# Patient Record
Sex: Female | Born: 1985 | Race: White | Hispanic: No | Marital: Married | Smoking: Former smoker
Health system: Southern US, Community
[De-identification: ages and names within clinical notes are randomized; demographics above are authoritative.]

## PROBLEM LIST (undated history)

## (undated) ENCOUNTER — Inpatient Hospital Stay (HOSPITAL_COMMUNITY): Payer: Self-pay

## (undated) DIAGNOSIS — O021 Missed abortion: Secondary | ICD-10-CM

## (undated) DIAGNOSIS — D649 Anemia, unspecified: Secondary | ICD-10-CM

## (undated) DIAGNOSIS — K219 Gastro-esophageal reflux disease without esophagitis: Secondary | ICD-10-CM

## (undated) DIAGNOSIS — J45909 Unspecified asthma, uncomplicated: Secondary | ICD-10-CM

---

## 2011-05-01 HISTORY — PX: LAPAROSCOPIC CHOLECYSTECTOMY: SUR755

## 2011-07-01 ENCOUNTER — Emergency Department (HOSPITAL_BASED_OUTPATIENT_CLINIC_OR_DEPARTMENT_OTHER)
Admission: EM | Admit: 2011-07-01 | Discharge: 2011-07-01 | Disposition: A | Discharge status: Home Health | Payer: PRIVATE HEALTH INSURANCE | Attending: Emergency Medicine | Admitting: Emergency Medicine

## 2011-07-01 ENCOUNTER — Encounter (HOSPITAL_BASED_OUTPATIENT_CLINIC_OR_DEPARTMENT_OTHER): Payer: Self-pay

## 2011-07-01 DIAGNOSIS — R112 Nausea with vomiting, unspecified: Secondary | ICD-10-CM | POA: Insufficient documentation

## 2011-07-01 DIAGNOSIS — R109 Unspecified abdominal pain: Secondary | ICD-10-CM | POA: Insufficient documentation

## 2011-07-01 DIAGNOSIS — R5381 Other malaise: Secondary | ICD-10-CM | POA: Insufficient documentation

## 2011-07-01 DIAGNOSIS — IMO0001 Reserved for inherently not codable concepts without codable children: Secondary | ICD-10-CM | POA: Insufficient documentation

## 2011-07-01 DIAGNOSIS — R000 Tachycardia, unspecified: Secondary | ICD-10-CM | POA: Insufficient documentation

## 2011-07-01 DIAGNOSIS — Z79899 Other long term (current) drug therapy: Secondary | ICD-10-CM | POA: Insufficient documentation

## 2011-07-01 DIAGNOSIS — R5383 Other fatigue: Secondary | ICD-10-CM | POA: Insufficient documentation

## 2011-07-01 LAB — DIFFERENTIAL
Basophils Relative: 0 % (ref 0–1)
Lymphs Abs: 0.5 10*3/uL — ABNORMAL LOW (ref 0.7–4.0)
Monocytes Absolute: 0.4 10*3/uL (ref 0.1–1.0)
Monocytes Relative: 5 % (ref 3–12)
Neutro Abs: 8.5 10*3/uL — ABNORMAL HIGH (ref 1.7–7.7)

## 2011-07-01 LAB — COMPREHENSIVE METABOLIC PANEL
Albumin: 4.4 g/dL (ref 3.5–5.2)
BUN: 13 mg/dL (ref 6–23)
Chloride: 99 mEq/L (ref 96–112)
Creatinine, Ser: 0.7 mg/dL (ref 0.50–1.10)
GFR calc Af Amer: >90 mL/min (ref >90)
GFR calc non Af Amer: >90 mL/min (ref >90)
Glucose, Bld: 122 mg/dL — ABNORMAL HIGH (ref 70–99)
Total Bilirubin: 0.5 mg/dL (ref 0.3–1.2)

## 2011-07-01 LAB — CBC
HCT: 41.8 % (ref 36.0–46.0)
Hemoglobin: 15.2 g/dL — ABNORMAL HIGH (ref 12.0–15.0)
MCH: 30.2 pg (ref 26.0–34.0)
MCHC: 36.4 g/dL — ABNORMAL HIGH (ref 30.0–36.0)

## 2011-07-01 LAB — URINALYSIS, ROUTINE W REFLEX MICROSCOPIC
Glucose, UA: NEGATIVE mg/dL
Hgb urine dipstick: NEGATIVE
Ketones, ur: 15 mg/dL — AB
Protein, ur: NEGATIVE mg/dL

## 2011-07-01 LAB — LIPASE, BLOOD: Lipase: 10 U/L — ABNORMAL LOW (ref 11–59)

## 2011-07-01 LAB — PREGNANCY, URINE: Preg Test, Ur: NEGATIVE

## 2011-07-01 MED ORDER — ONDANSETRON HCL 4 MG/2ML IJ SOLN
4.0000 mg | Freq: Once | INTRAMUSCULAR | Status: AC
  Administered 2011-07-01: 4 mg via INTRAVENOUS
  Filled 2011-07-01: qty 2

## 2011-07-01 MED ORDER — ONDANSETRON 8 MG PO TBDP: 8.0000 mg | ORAL_TABLET | Freq: Three times a day (TID) | ORAL | Status: AC | PRN

## 2011-07-01 MED ORDER — OXYCODONE-ACETAMINOPHEN 5-325 MG PO TABS: 1.0000 | ORAL_TABLET | Freq: Four times a day (QID) | ORAL | Status: AC | PRN

## 2011-07-01 MED ORDER — SODIUM CHLORIDE 0.9 % IV BOLUS (SEPSIS)
1000.0000 mL | Freq: Once | INTRAVENOUS | Status: AC
  Administered 2011-07-01: 1000 mL via INTRAVENOUS

## 2011-07-01 NOTE — ED Provider Notes (Signed)
History     CSN: [id number]  Arrival date & time 07/01/11  1118   First MD Initiated Contact with Patient 07/01/11 1136      Chief Complaint  Patient presents with  . Abdominal Pain    (Consider location/radiation/quality/duration/timing/severity/associated sxs/prior treatment) Patient is a 26 y.o. female presenting with abdominal pain. The history is provided by the patient.  Abdominal Pain The primary symptoms of the illness include abdominal pain, fatigue, nausea and vomiting. The primary symptoms of the illness do not include shortness of breath or diarrhea.  Symptoms associated with the illness do not include back pain.   patient's had nausea vomiting since this morning. No diarrhea. She states she's had some blackish stools. She's had no chills. Mild upper abdominal pain. She states she has had people at work that had nausea vomiting diarrhea and chills. Patient denies possibility of pregnancy. She states she had a couple glasses wine a couple days ago.  History reviewed. No pertinent past medical history.  History reviewed. No pertinent past surgical history.  History reviewed. No pertinent family history.  History  Substance Use Topics  . Smoking status: Former Smoker    Quit date: 07/01/2010  . Smokeless tobacco: Not on file  . Alcohol Use: Yes     occasional use    OB History    Grav Para Term Preterm Abortions TAB SAB Ect Mult Living                  Review of Systems  Constitutional: Positive for fatigue. Negative for activity change and appetite change.  HENT: Negative for neck stiffness.   Eyes: Negative for pain.  Respiratory: Negative for chest tightness and shortness of breath.   Cardiovascular: Negative for chest pain and leg swelling.  Gastrointestinal: Positive for nausea, vomiting and abdominal pain. Negative for diarrhea.  Genitourinary: Negative for flank pain.  Musculoskeletal: Positive for myalgias. Negative for back pain.  Skin: Negative for  rash.  Neurological: Negative for weakness, numbness and headaches.  Psychiatric/Behavioral: Negative for behavioral problems.    Allergies  Penicillins  Home Medications   Current Outpatient Rx  Name Route Sig Dispense Refill  . ALPRAZOLAM 0.5 MG PO TABS Oral Take 0.5 mg by mouth 3 (three) times daily as needed.    . BUPROPION HCL ER (XL) 300 MG PO TB24 Oral Take 300 mg by mouth daily.    Marland Kitchen PANTOPRAZOLE SODIUM 40 MG PO TBEC Oral Take 40 mg by mouth daily.    Marland Kitchen ONDANSETRON 8 MG PO TBDP Oral Take 1 tablet (8 mg total) by mouth every 8 (eight) hours as needed for nausea. 20 tablet 0  . OXYCODONE-ACETAMINOPHEN 5-325 MG PO TABS Oral Take 1-2 tablets by mouth every 6 (six) hours as needed for pain. 10 tablet 0    BP 107/74  Pulse 104  Temp 98.9 F (37.2 C)  Resp 19  Ht 5\' 5"  (1.651 m)  Wt 135 lb (61.236 kg)  BMI 22.47 kg/m2  SpO2 99%  LMP 06/24/2011  Physical Exam  Nursing note and vitals reviewed. Constitutional: She is oriented to person, place, and time. She appears well-developed and well-nourished.  HENT:  Head: Normocephalic and atraumatic.  Eyes: EOM are normal. Pupils are equal, round, and reactive to light.  Neck: Normal range of motion. Neck supple.  Cardiovascular: Regular rhythm and normal heart sounds.   No murmur heard.      Tachycardia  Pulmonary/Chest: Effort normal and breath sounds normal. No respiratory distress. She  has no wheezes. She has no rales.  Abdominal: Soft. Bowel sounds are normal. She exhibits no distension. There is tenderness. There is no rebound and no guarding.       Mild upper abdominal tenderness without rebound or guarding  Musculoskeletal: Normal range of motion.  Neurological: She is alert and oriented to person, place, and time. No cranial nerve deficit.  Skin: Skin is warm and dry.  Psychiatric: She has a normal mood and affect. Her speech is normal.    ED Course  Procedures (including critical care time)  Labs Reviewed    URINALYSIS, ROUTINE W REFLEX MICROSCOPIC - Abnormal; Notable for the following:    Color, Urine AMBER (*) BIOCHEMICALS MAY BE AFFECTED BY COLOR   Specific Gravity, Urine 1.034 (*)    Bilirubin Urine SMALL (*)    Ketones, ur 15 (*)    All other components within normal limits  CBC - Abnormal; Notable for the following:    Hemoglobin 15.2 (*)    MCHC 36.4 (*)    All other components within normal limits  DIFFERENTIAL - Abnormal; Notable for the following:    Neutrophils Relative 90 (*)    Neutro Abs 8.5 (*)    Lymphocytes Relative 5 (*)    Lymphs Abs 0.5 (*)    All other components within normal limits  COMPREHENSIVE METABOLIC PANEL - Abnormal; Notable for the following:    Potassium 3.4 (*)    Glucose, Bld 122 (*)    All other components within normal limits  LIPASE, BLOOD - Abnormal; Notable for the following:    Lipase 10 (*)    All other components within normal limits  PREGNANCY, URINE   No results found.   1. Abdominal pain   2. Nausea and vomiting       MDM  Upper abdominal pain. Nausea vomiting. Lab work is reassuring. sHe appears to be hemoconcentrated. He feels much better after fluids and her vitals are improved. She she is tolerated orals to be discharged home with pain medicines and antiemetics.        Juliet Rude. Rubin Payor, MD 07/01/11 1353

## 2011-07-01 NOTE — ED Notes (Signed)
Pt describes nonstop vomiting since this morning.  Pt states that she also has black stools, Pt with abdominal pain in the upper abdomen, soreness "from vomiting"

## 2011-10-22 ENCOUNTER — Other Ambulatory Visit: Payer: Self-pay | Admitting: Gastroenterology

## 2011-10-22 DIAGNOSIS — R1013 Epigastric pain: Secondary | ICD-10-CM

## 2011-10-26 ENCOUNTER — Other Ambulatory Visit: Payer: PRIVATE HEALTH INSURANCE

## 2011-10-26 ENCOUNTER — Encounter: Payer: Self-pay | Admitting: Obstetrics and Gynecology

## 2011-11-08 DIAGNOSIS — G4700 Insomnia, unspecified: Secondary | ICD-10-CM | POA: Insufficient documentation

## 2011-11-09 ENCOUNTER — Encounter: Payer: Self-pay | Admitting: Obstetrics and Gynecology

## 2012-01-23 ENCOUNTER — Inpatient Hospital Stay (HOSPITAL_COMMUNITY): Payer: PRIVATE HEALTH INSURANCE

## 2012-01-23 ENCOUNTER — Encounter (HOSPITAL_COMMUNITY): Payer: Self-pay

## 2012-01-23 ENCOUNTER — Inpatient Hospital Stay (HOSPITAL_COMMUNITY)
Admission: AD | Admit: 2012-01-23 | Discharge: 2012-01-23 | Disposition: A | Discharge status: Home / Self Care | Payer: PRIVATE HEALTH INSURANCE | Source: Ambulatory Visit | Attending: Obstetrics and Gynecology | Admitting: Obstetrics and Gynecology

## 2012-01-23 DIAGNOSIS — O26859 Spotting complicating pregnancy, unspecified trimester: Secondary | ICD-10-CM

## 2012-01-23 DIAGNOSIS — R109 Unspecified abdominal pain: Secondary | ICD-10-CM | POA: Insufficient documentation

## 2012-01-23 DIAGNOSIS — O209 Hemorrhage in early pregnancy, unspecified: Secondary | ICD-10-CM

## 2012-01-23 HISTORY — DX: Unspecified asthma, uncomplicated: J45.909

## 2012-01-23 LAB — CBC
HCT: 35.3 % — ABNORMAL LOW (ref 36.0–46.0)
MCHC: 34.6 g/dL (ref 30.0–36.0)
Platelets: 192 10*3/uL (ref 150–400)
RDW: 12.1 % (ref 11.5–15.5)
WBC: 6.9 10*3/uL (ref 4.0–10.5)

## 2012-01-23 LAB — POCT PREGNANCY, URINE: Preg Test, Ur: POSITIVE — AB

## 2012-01-23 LAB — ABO/RH: ABO/RH(D): O POS

## 2012-01-23 LAB — HCG, QUANTITATIVE, PREGNANCY: hCG, Beta Chain, Quant, S: 1565 m[IU]/mL — ABNORMAL HIGH (ref <5)

## 2012-01-23 NOTE — MAU Note (Signed)
Was at work, started having cramping in lower abd and noted some ?  Brownish spotting.

## 2012-01-23 NOTE — MAU Provider Note (Signed)
History     CSN: [id number]  Arrival date and time: 01/23/12 1649   First Provider Initiated Contact with Patient 01/23/12 1748      Chief Complaint  Patient presents with  . Vaginal Bleeding  . Abdominal Pain   HPIKrystin Lucila Rice is 26 y.o. G1P0 [redacted]w[redacted]d weeks presenting with cramping X 1 week and dark brown discharge.  [facility]ed [facility] instructed her to come here because she doesn't start prenatal care there until Oct 7.  LMP 12/15/11.  Slight nausea without vomiting.  Last intercourse 2 weeks ago.  Patient was Xanax prn and Wellbutrin, originally prescribed to stop smoking and kept taking it.  Discontinued both end of August.  Denies adverse affects from discontinuation of these medications.      Past Medical History  Diagnosis Date  . Anxiety   . Asthma     sports induced    Past Surgical History  Procedure Date  . Cholecystectomy     History reviewed. No pertinent family history.  History  Substance Use Topics  . Smoking status: Former Smoker    Quit date: 07/01/2010  . Smokeless tobacco: Not on file  . Alcohol Use: Yes     occasional use    Allergies:  Allergies  Allergen Reactions  . Penicillins Other (See Comments)    Migraines   . Ciprofloxacin Nausea And Vomiting    Prescriptions prior to admission  Medication Sig Dispense Refill  . ALPRAZolam (XANAX) 0.5 MG tablet Take 0.5 mg by mouth 3 (three) times daily as needed.      Marland Kitchen buPROPion (WELLBUTRIN XL) 300 MG 24 hr tablet Take 300 mg by mouth daily.      Marland Kitchen HYDROcodone-acetaminophen (VICODIN) 2.5-500 MG per tablet Take 1 tablet by mouth every 6 (six) hours as needed. cramping      . pantoprazole (PROTONIX) 40 MG tablet Take 40 mg by mouth daily.        Review of Systems  Constitutional: Negative.   Respiratory: Negative.   Cardiovascular: Negative.   Gastrointestinal: Positive for nausea and abdominal pain (cramping). Negative for vomiting.  Genitourinary:       + brown spotting    Physical Exam   Blood pressure 119/96, pulse 116, temperature 98.1 F (36.7 C), temperature source Oral, resp. rate 18, height 5\' 4"  (1.626 m), weight 60.782 kg (134 lb), last menstrual period 12/15/2011.  Physical Exam  Constitutional: She is oriented to person, place, and time. She appears well-developed and well-nourished. No distress.  HENT:  Head: Normocephalic.  Neck: Normal range of motion.  Cardiovascular: Normal rate.   Respiratory: Effort normal.  GI: Soft. She exhibits no distension and no mass. There is no tenderness. There is no rebound and no guarding.  Genitourinary: Uterus is enlarged (slightly). Uterus is not tender. Cervix exhibits friability. Cervix exhibits no discharge. Right adnexum displays no mass, no tenderness and no fullness. Left adnexum displays no mass, no tenderness and no fullness. No bleeding (without active bleeding) around the vagina. Vaginal discharge (brown streaking.  No odor) found.  Neurological: She is alert and oriented to person, place, and time.  Skin: Skin is warm and dry.  Psychiatric: She has a normal mood and affect. Her behavior is normal.   Results for orders placed during the hospital encounter of 01/23/12 (from the past 24 hour(s))  POCT PREGNANCY, URINE     Status: Abnormal   Collection Time   01/23/12  5:35 PM      Component Value Range  Preg Test, Ur POSITIVE (*) NEGATIVE  CBC     Status: Abnormal   Collection Time   01/23/12  6:04 PM      Component Value Range   WBC 6.9  4.0 - 10.5 K/uL   RBC 4.10  3.87 - 5.11 MIL/uL   Hemoglobin 12.2  12.0 - 15.0 g/dL   HCT 96.0 (*) 45.4 - 09.8 %   MCV 86.1  78.0 - 100.0 fL   MCH 29.8  26.0 - 34.0 pg   MCHC 34.6  30.0 - 36.0 g/dL   RDW 11.9  14.7 - 82.9 %   Platelets 192  150 - 400 K/uL  ABO/RH     Status: Normal   Collection Time   01/23/12  6:04 PM      Component Value Range   ABO/RH(D) O POS    HCG, QUANTITATIVE, PREGNANCY     Status: Abnormal   Collection Time   01/23/12  6:04  PM      Component Value Range   hCG, Beta Chain, Quant, S 1565 (*) <5 mIU/mL   Clinical Data: 26 year old female who is 5 weeks and 4 days  gestational age by LMP with quantitative beta HCG of 1565.  Spotting and pain.  OBSTETRIC <14 WK [place] AND TRANSVAGINAL OB US  Technique: Both transabdominal and transvaginal ultrasound  examinations were performed for complete evaluation of the  gestation as well as the maternal uterus, adnexal regions, and  pelvic cul-de-sac. Transvaginal technique was performed to assess  early pregnancy.  Comparison: None.  Intrauterine gestational sac: Possible single  Yolk sac: Not visible  Embryo: Not visible  Cardiac Activity: Not detected  MSD: 3.1 mm 4 w 5 d  [place] EDC: 09/26/2012  Maternal uterus/adnexae:  No subchorionic hemorrhage or pelvic free fluid. Both ovaries are  somewhat heterogeneous but normal in size. The left measures 2.6 x  1.5 x 1.5 cm. The right measures 2.8 x 2.0 x 2.0 cm and probably  contains the corpus luteum (see image 23).  IMPRESSION:  1. Possible early intrauterine pregnancy, but no yolk sac or fetal  pole identified. Mean sac diameter would suggest a gestational age  of [redacted] weeks and 5 days. Recommend correlation with serial  quantitative BHCG and followup imaging.  2. No subchorionic hemorrhage or free fluid. No suspicious  adnexal finding.  Original Report Authenticated By: Harley Hallmark, M.D.    MAU Course  Procedures  MDM Patient declined wet prep/cultures because she has first OB appt on Oct 7 and is aware they will be done at that time.  Assessment and Plan  A:  Spotting in early pregnancy [redacted]w[redacted]d gestation  P:  Repeat BHCG in Saturday am on 9/28.     Pelvic rest     Return for heavy vaginal bleeding or increased pain.  [name],EVE M 01/23/2012, 5:49 PM

## 2012-01-23 NOTE — MAU Note (Signed)
Patient is in with c/o having brown blood bleeding that started today. She also states that she is having intermittent mild cramping. She have no recent intercourse.

## 2012-01-26 ENCOUNTER — Inpatient Hospital Stay (HOSPITAL_COMMUNITY)
Admission: AD | Admit: 2012-01-26 | Discharge: 2012-01-26 | Disposition: A | Discharge status: Home / Self Care | Payer: PRIVATE HEALTH INSURANCE | Source: Ambulatory Visit | Attending: Obstetrics & Gynecology | Admitting: Obstetrics & Gynecology

## 2012-01-26 DIAGNOSIS — O26859 Spotting complicating pregnancy, unspecified trimester: Secondary | ICD-10-CM

## 2012-01-26 NOTE — MAU Provider Note (Signed)
  History     CSN: [id number]  Arrival date and time: 01/26/12 1247   First Provider Initiated Contact with Patient 01/26/12 1412      Chief Complaint  Patient presents with  . Follow-up   HPI Cindy Rice 26 y.o. [redacted]w[redacted]d  Here for follow up quant today.  Was seen on 01-23-12.  Records reviewed.  No pain or bleeding today.  OB History    Grav Para Term Preterm Abortions TAB SAB Ect Mult Living   1               Past Medical History  Diagnosis Date  . Anxiety   . Asthma     sports induced    Past Surgical History  Procedure Date  . Cholecystectomy     No family history on file.  History  Substance Use Topics  . Smoking status: Former Smoker    Quit date: 07/01/2010  . Smokeless tobacco: Not on file  . Alcohol Use: Yes     occasional use    Allergies:  Allergies  Allergen Reactions  . Penicillins Other (See Comments)    Migraines   . Ciprofloxacin Nausea And Vomiting    Prescriptions prior to admission  Medication Sig Dispense Refill  . pantoprazole (PROTONIX) 40 MG tablet Take 40 mg by mouth daily.        Review of Systems  Constitutional: Negative for fever.  Gastrointestinal: Negative for nausea, vomiting and abdominal pain.  Genitourinary: Negative for dysuria.       No vaginal bleeding today   Physical Exam   Blood pressure 118/84, pulse 104, temperature 98.1 F (36.7 C), temperature source Oral, resp. rate 16, last menstrual period 12/15/2011, SpO2 100.00%.  Physical Exam  Nursing note and vitals reviewed. Constitutional: She is oriented to person, place, and time. She appears well-developed and well-nourished.  HENT:  Head: Normocephalic.  Eyes: EOM are normal.  Neck: Neck supple.  Musculoskeletal: Normal range of motion.  Neurological: She is alert and oriented to person, place, and time.  Skin: Skin is warm and dry.  Psychiatric: She has a normal mood and affect.    MAU Course  Procedures  MDM Results for Cindy, Rice  (MRN [id number]) as of 01/26/2012 14:19  Ref. Range 07/01/2011 11:47 07/01/2011 12:52 01/23/2012 17:35 01/23/2012 18:04 01/23/2012 19:42 01/24/2012 02:11 01/26/2012 13:18  hCG, Beta Chain, Quant, S Latest Range: <5 mIU/mL    1565 (H)   5528 (H)    Assessment and Plan  Rising Quants  Plan Ultrasound will call to schedule [place] on 01-30-12 as follow up.  Cindy Rice 01/26/2012, 2:13 PM

## 2012-01-26 NOTE — MAU Note (Signed)
Patient to MAU for repeat BHCG. Patient denies any pain or bleeding.  

## 2012-01-30 ENCOUNTER — Other Ambulatory Visit (HOSPITAL_COMMUNITY): Payer: Self-pay | Admitting: Nurse Practitioner

## 2012-01-30 ENCOUNTER — Ambulatory Visit (HOSPITAL_COMMUNITY)
Admission: RE | Admit: 2012-01-30 | Discharge: 2012-01-30 | Disposition: A | Discharge status: Home / Self Care | Payer: PRIVATE HEALTH INSURANCE | Source: Ambulatory Visit | Attending: Nurse Practitioner | Admitting: Nurse Practitioner

## 2012-01-30 ENCOUNTER — Encounter (HOSPITAL_COMMUNITY): Payer: Self-pay | Admitting: *Deleted

## 2012-01-30 ENCOUNTER — Inpatient Hospital Stay (HOSPITAL_COMMUNITY)
Admission: AD | Admit: 2012-01-30 | Discharge: 2012-01-30 | Disposition: A | Discharge status: Home / Self Care | Payer: PRIVATE HEALTH INSURANCE | Source: Ambulatory Visit | Attending: Obstetrics & Gynecology | Admitting: Obstetrics & Gynecology

## 2012-01-30 DIAGNOSIS — Z3490 Encounter for supervision of normal pregnancy, unspecified, unspecified trimester: Secondary | ICD-10-CM

## 2012-01-30 DIAGNOSIS — O3680X0 Pregnancy with inconclusive fetal viability, not applicable or unspecified: Secondary | ICD-10-CM | POA: Insufficient documentation

## 2012-01-30 DIAGNOSIS — O26859 Spotting complicating pregnancy, unspecified trimester: Secondary | ICD-10-CM

## 2012-01-30 DIAGNOSIS — O99891 Other specified diseases and conditions complicating pregnancy: Secondary | ICD-10-CM | POA: Insufficient documentation

## 2012-01-30 DIAGNOSIS — Z1389 Encounter for screening for other disorder: Secondary | ICD-10-CM

## 2012-01-30 DIAGNOSIS — Z3689 Encounter for other specified antenatal screening: Secondary | ICD-10-CM | POA: Insufficient documentation

## 2012-01-30 NOTE — MAU Note (Signed)
Rollover from [place], viability scan.  No complaints.

## 2012-01-30 NOTE — MAU Provider Note (Signed)
  Cindy Rice is a 26 y.o. female who returns for follow up ultrasound for progression of pregnancy. Today on ultrasound there is a YS.  Patient is to start prenatal care and return as needed for problems. LMP 12/15/2011  [name]

## 2012-02-14 LAB — OB RESULTS CONSOLE RPR: RPR: NONREACTIVE

## 2012-04-30 NOTE — L&D Delivery Note (Signed)
Operative Delivery Note At 6:43 PM a viable female was delivered via Vaginal, Spontaneous Delivery.  Presentation: vertex; Position: Right,, Occiput,, Transverse; Station: +2.  Verbal consent: obtained from patient.  Risks and benefits discussed in detail.  Risks include, but are not limited to the risks of anesthesia, bleeding, infection, damage to maternal tissues, fetal cephalhematoma.  There is also the risk of inability to effect vaginal delivery of the head, or shoulder dystocia that cannot be resolved by established maneuvers, leading to the need for emergency cesarean section.  APGAR: 9, 9; weight .   Placenta status: Intact, Spontaneous.   Cord: 3 vessels with the following complications: None.  Cord pH: not done  Anesthesia: Epidural  Instruments: mushroom Episiotomy: None Lacerations: 2nd degree Suture Repair: 3.0 chromic Est. Blood Loss (mL): 300  Mom to postpartum.  Baby to nursery-stable.  Kourtnie Sachs L 09/23/2012, 6:59 PM

## 2012-09-18 ENCOUNTER — Encounter (HOSPITAL_COMMUNITY): Payer: Self-pay | Admitting: *Deleted

## 2012-09-18 ENCOUNTER — Inpatient Hospital Stay (HOSPITAL_COMMUNITY)
Admission: AD | Admit: 2012-09-18 | Discharge: 2012-09-18 | Disposition: A | Discharge status: Home / Self Care | Payer: 59 | Source: Ambulatory Visit | Attending: Obstetrics and Gynecology | Admitting: Obstetrics and Gynecology

## 2012-09-18 DIAGNOSIS — O479 False labor, unspecified: Secondary | ICD-10-CM | POA: Insufficient documentation

## 2012-09-18 NOTE — MAU Note (Signed)
Pt states pain and pressure began earlier in the afternoon and noticed some brown mucus around noon. Denies LOF. Good FM.

## 2012-09-23 ENCOUNTER — Inpatient Hospital Stay (HOSPITAL_COMMUNITY)
Admission: AD | Admit: 2012-09-23 | Discharge: 2012-09-25 | DRG: 775 | Disposition: A | Discharge status: Home / Self Care | Payer: 59 | Source: Ambulatory Visit | Attending: Obstetrics and Gynecology | Admitting: Obstetrics and Gynecology

## 2012-09-23 ENCOUNTER — Encounter (HOSPITAL_COMMUNITY): Payer: Self-pay | Admitting: Anesthesiology

## 2012-09-23 ENCOUNTER — Encounter (HOSPITAL_COMMUNITY): Payer: Self-pay | Admitting: *Deleted

## 2012-09-23 ENCOUNTER — Inpatient Hospital Stay (HOSPITAL_COMMUNITY): Payer: 59 | Admitting: Anesthesiology

## 2012-09-23 LAB — CBC
HCT: 36.4 % (ref 36.0–46.0)
HCT: 33.5 % — ABNORMAL LOW (ref 36.0–46.0)
Hemoglobin: 12.6 g/dL (ref 12.0–15.0)
Hemoglobin: 11.4 g/dL — ABNORMAL LOW (ref 12.0–15.0)
MCH: 29.9 pg (ref 26.0–34.0)
MCH: 29.8 pg (ref 26.0–34.0)
MCHC: 34 g/dL (ref 30.0–36.0)
MCV: 86.3 fL (ref 78.0–100.0)
MCV: 87.5 fL (ref 78.0–100.0)
Platelets: 124 10*3/uL — ABNORMAL LOW (ref 150–400)
Platelets: 123 K/uL — ABNORMAL LOW (ref 150–400)
RBC: 4.22 MIL/uL (ref 3.87–5.11)
RBC: 3.83 MIL/uL — ABNORMAL LOW (ref 3.87–5.11)
RDW: 13.2 % (ref 11.5–15.5)
WBC: 11.7 10*3/uL — ABNORMAL HIGH (ref 4.0–10.5)
WBC: 19.9 K/uL — ABNORMAL HIGH (ref 4.0–10.5)

## 2012-09-23 LAB — OB RESULTS CONSOLE GBS: GBS: NEGATIVE

## 2012-09-23 MED ORDER — DIPHENHYDRAMINE HCL 25 MG PO CAPS: 25.0000 mg | ORAL_CAPSULE | Freq: Four times a day (QID) | ORAL | Status: DC | PRN

## 2012-09-23 MED ORDER — WITCH HAZEL-GLYCERIN EX PADS
1.0000 "application " | MEDICATED_PAD | CUTANEOUS | Status: DC | PRN
  Administered 2012-09-24: 1 via TOPICAL

## 2012-09-23 MED ORDER — OXYTOCIN 40 UNITS IN LACTATED RINGERS INFUSION - SIMPLE MED
62.5000 mL/h | INTRAVENOUS | Status: DC
  Filled 2012-09-23: qty 1000

## 2012-09-23 MED ORDER — OXYCODONE-ACETAMINOPHEN 5-325 MG PO TABS: 1.0000 | ORAL_TABLET | ORAL | Status: DC | PRN

## 2012-09-23 MED ORDER — ONDANSETRON HCL 4 MG/2ML IJ SOLN: 4.0000 mg | INTRAMUSCULAR | Status: DC | PRN

## 2012-09-23 MED ORDER — BISACODYL 10 MG RE SUPP: 10.0000 mg | Freq: Every day | RECTAL | Status: DC | PRN

## 2012-09-23 MED ORDER — LACTATED RINGERS IV SOLN: 500.0000 mL | INTRAVENOUS | Status: DC | PRN

## 2012-09-23 MED ORDER — CITRIC ACID-SODIUM CITRATE 334-500 MG/5ML PO SOLN: 30.0000 mL | ORAL | Status: DC | PRN

## 2012-09-23 MED ORDER — IBUPROFEN 600 MG PO TABS: 600.0000 mg | ORAL_TABLET | Freq: Four times a day (QID) | ORAL | Status: DC | PRN

## 2012-09-23 MED ORDER — SENNOSIDES-DOCUSATE SODIUM 8.6-50 MG PO TABS
2.0000 | ORAL_TABLET | Freq: Every day | ORAL | Status: DC
  Administered 2012-09-23 – 2012-09-24 (×2): 2 via ORAL

## 2012-09-23 MED ORDER — ONDANSETRON HCL 4 MG/2ML IJ SOLN
4.0000 mg | Freq: Four times a day (QID) | INTRAMUSCULAR | Status: DC | PRN
  Administered 2012-09-23: 4 mg via INTRAVENOUS
  Filled 2012-09-23: qty 2

## 2012-09-23 MED ORDER — SIMETHICONE 80 MG PO CHEW: 80.0000 mg | CHEWABLE_TABLET | ORAL | Status: DC | PRN

## 2012-09-23 MED ORDER — PHENYLEPHRINE 40 MCG/ML (10ML) SYRINGE FOR IV PUSH (FOR BLOOD PRESSURE SUPPORT)
80.0000 ug | PREFILLED_SYRINGE | INTRAVENOUS | Status: DC | PRN
  Filled 2012-09-23: qty 2

## 2012-09-23 MED ORDER — ONDANSETRON HCL 4 MG PO TABS: 4.0000 mg | ORAL_TABLET | ORAL | Status: DC | PRN

## 2012-09-23 MED ORDER — OXYCODONE-ACETAMINOPHEN 5-325 MG PO TABS
1.0000 | ORAL_TABLET | ORAL | Status: DC | PRN
  Administered 2012-09-24 (×2): 1 via ORAL
  Filled 2012-09-23 (×2): qty 1

## 2012-09-23 MED ORDER — PRENATAL MULTIVITAMIN CH
1.0000 | ORAL_TABLET | Freq: Every day | ORAL | Status: DC
  Administered 2012-09-24: 1 via ORAL
  Filled 2012-09-23: qty 1

## 2012-09-23 MED ORDER — MEASLES, MUMPS & RUBELLA VAC ~~LOC~~ INJ: 0.5000 mL | INJECTION | Freq: Once | SUBCUTANEOUS | Status: DC

## 2012-09-23 MED ORDER — TETANUS-DIPHTH-ACELL PERTUSSIS 5-2.5-18.5 LF-MCG/0.5 IM SUSP: 0.5000 mL | Freq: Once | INTRAMUSCULAR | Status: DC

## 2012-09-23 MED ORDER — DIBUCAINE 1 % RE OINT: 1.0000 "application " | TOPICAL_OINTMENT | RECTAL | Status: DC | PRN

## 2012-09-23 MED ORDER — LANOLIN HYDROUS EX OINT: TOPICAL_OINTMENT | CUTANEOUS | Status: DC | PRN

## 2012-09-23 MED ORDER — LIDOCAINE HCL (PF) 1 % IJ SOLN
30.0000 mL | INTRAMUSCULAR | Status: DC | PRN
  Filled 2012-09-23: qty 30

## 2012-09-23 MED ORDER — EPHEDRINE 5 MG/ML INJ
10.0000 mg | INTRAVENOUS | Status: DC | PRN
  Filled 2012-09-23: qty 2
  Filled 2012-09-23: qty 4

## 2012-09-23 MED ORDER — LIDOCAINE HCL (PF) 1 % IJ SOLN
INTRAMUSCULAR | Status: DC | PRN
  Administered 2012-09-23 (×4): 4 mL

## 2012-09-23 MED ORDER — DIPHENHYDRAMINE HCL 50 MG/ML IJ SOLN: 12.5000 mg | INTRAMUSCULAR | Status: DC | PRN

## 2012-09-23 MED ORDER — FENTANYL 2.5 MCG/ML BUPIVACAINE 1/10 % EPIDURAL INFUSION (WH - ANES)
14.0000 mL/h | INTRAMUSCULAR | Status: DC | PRN
  Administered 2012-09-23: 14 mL/h via EPIDURAL
  Filled 2012-09-23: qty 125

## 2012-09-23 MED ORDER — ACETAMINOPHEN 325 MG PO TABS: 650.0000 mg | ORAL_TABLET | ORAL | Status: DC | PRN

## 2012-09-23 MED ORDER — ZOLPIDEM TARTRATE 5 MG PO TABS: 5.0000 mg | ORAL_TABLET | Freq: Every evening | ORAL | Status: DC | PRN

## 2012-09-23 MED ORDER — OXYTOCIN BOLUS FROM INFUSION: 500.0000 mL | INTRAVENOUS | Status: DC

## 2012-09-23 MED ORDER — FLEET ENEMA 7-19 GM/118ML RE ENEM: 1.0000 | ENEMA | Freq: Every day | RECTAL | Status: DC | PRN

## 2012-09-23 MED ORDER — EPHEDRINE 5 MG/ML INJ
10.0000 mg | INTRAVENOUS | Status: DC | PRN
  Filled 2012-09-23: qty 2

## 2012-09-23 MED ORDER — LACTATED RINGERS IV SOLN: INTRAVENOUS | Status: DC

## 2012-09-23 MED ORDER — IBUPROFEN 600 MG PO TABS
600.0000 mg | ORAL_TABLET | Freq: Four times a day (QID) | ORAL | Status: DC
  Administered 2012-09-24 – 2012-09-25 (×6): 600 mg via ORAL
  Filled 2012-09-23 (×6): qty 1

## 2012-09-23 MED ORDER — BENZOCAINE-MENTHOL 20-0.5 % EX AERO
1.0000 "application " | INHALATION_SPRAY | CUTANEOUS | Status: DC | PRN
  Administered 2012-09-24: 1 via TOPICAL
  Filled 2012-09-23: qty 56

## 2012-09-23 MED ORDER — MEDROXYPROGESTERONE ACETATE 150 MG/ML IM SUSP: 150.0000 mg | INTRAMUSCULAR | Status: DC | PRN

## 2012-09-23 MED ORDER — LACTATED RINGERS IV SOLN: 500.0000 mL | Freq: Once | INTRAVENOUS | Status: DC

## 2012-09-23 MED ORDER — PHENYLEPHRINE 40 MCG/ML (10ML) SYRINGE FOR IV PUSH (FOR BLOOD PRESSURE SUPPORT)
80.0000 ug | PREFILLED_SYRINGE | INTRAVENOUS | Status: DC | PRN
  Filled 2012-09-23: qty 2
  Filled 2012-09-23: qty 5

## 2012-09-23 NOTE — MAU Note (Signed)
UC's all night about apart. Water broke @ 0600. UC's more intense after that. States has had some BP concerns recently and to be scheduled for induction

## 2012-09-23 NOTE — H&P (Signed)
Cindy Rice is a 27 y.o. female presenting for SROM Maternal Medical History:  Reason for admission: Rupture of membranes.   Contractions: Onset was 3-5 hours ago.   Frequency: regular.   Perceived severity is strong.    Fetal activity: Perceived fetal activity is normal.      OB History   Grav Para Term Preterm Abortions TAB SAB Ect Mult Living   2    1  1    0     Past Medical History  Diagnosis Date  . Anxiety   . Asthma     sports induced   Past Surgical History  Procedure Laterality Date  . Cholecystectomy     Family History: family history is negative for Other. Social History:  reports that she quit smoking about 2 years ago. She has never used smokeless tobacco. She reports that  drinks alcohol. She reports that she does not use illicit drugs.   Prenatal Transfer Tool  Maternal Diabetes: No Genetic Screening: Normal Maternal Ultrasounds/Referrals: Normal Fetal Ultrasounds or other Referrals:  None Maternal Substance Abuse:  No Significant Maternal Medications:  None Significant Maternal Lab Results:  None Other Comments:  None  Review of Systems  All other systems reviewed and are negative.    Dilation: 5 Effacement (%): 100 Station: -1 Exam by:: Safi Culotta Blood pressure 131/73, pulse 87, temperature 98.5 F (36.9 C), temperature source Oral, resp. rate 18, height 5\' 5"  (1.651 m), weight 78.926 kg (174 lb), last menstrual period 12/15/2011, SpO2 99.00%. Maternal Exam:  Uterine Assessment: Contraction strength is mild.  Contraction frequency is irregular.   Abdomen: Patient reports no abdominal tenderness. Fetal presentation: vertex  Introitus: Normal vulva. Normal vagina.    Physical Exam  Vitals reviewed. Constitutional: She appears well-developed.  Cardiovascular: Normal rate and regular rhythm.   Respiratory: Effort normal and breath sounds normal.    Prenatal labs: ABO, Rh: --/--/O POS (09/25 1804) Antibody:   Rubella: Immune (10/17  0000) RPR: Nonreactive (10/17 0000)  HBsAg: Negative (10/17 0000)  HIV: Non-reactive (10/17 0000)  GBS: Negative (05/27 0000)   Assessment/Plan: SROM Labor Status post epidural Anticipate NSVD  Ivyrose Hashman L 09/23/2012, 2:00 PM

## 2012-09-23 NOTE — Anesthesia Procedure Notes (Signed)
Epidural Patient location during procedure: OB Start time: 09/23/2012 12:02 PM  Staffing Performed by: anesthesiologist   Preanesthetic Checklist Completed: patient identified, site marked, surgical consent, pre-op evaluation, timeout performed, IV checked, risks and benefits discussed and monitors and equipment checked  Epidural Patient position: sitting Prep: site prepped and draped and DuraPrep Patient monitoring: continuous pulse ox and blood pressure Approach: midline Injection technique: LOR air  Needle:  Needle type: Tuohy  Needle gauge: 17 G Needle length: 9 cm and 9 Needle insertion depth: 4.5 cm Catheter type: closed end flexible Catheter size: 19 Gauge Catheter at skin depth: 9.5 cm Test dose: negative  Assessment Events: blood not aspirated, injection not painful, no injection resistance, negative IV test and no paresthesia  Additional Notes Discussed risk of headache, infection, bleeding, nerve injury and failed or incomplete block.  Patient voices understanding and wishes to proceed.  Epidural placed easily on first attempt.  No paresthesia.  Patient tolerated procedure well with no apparent complications.  Jasmine December, MD Reason for block:procedure for pain

## 2012-09-23 NOTE — MAU Note (Signed)
Started leaking at 0630.  Had been contracting all night.  Dr's had been discussing inducing today or tomorrow, BP has been going up.

## 2012-09-23 NOTE — Anesthesia Preprocedure Evaluation (Signed)
Anesthesia Evaluation  Patient identified by MRN, date of birth, ID band Patient awake    Reviewed: Allergy & Precautions, H&P , NPO status , Patient's Chart, lab work & pertinent test results, reviewed documented beta blocker date and time   History of Anesthesia Complications Negative for: history of anesthetic complications  Airway Mallampati: III TM Distance: >3 FB Neck ROM: full    Dental  (+) Teeth Intact   Pulmonary asthma (exercise-induced, remote history) , former smoker,  breath sounds clear to auscultation        Cardiovascular negative cardio ROS  Rhythm:regular Rate:Normal     Neuro/Psych PSYCHIATRIC DISORDERS (anxiety) negative neurological ROS  negative psych ROS   GI/Hepatic negative GI ROS, Neg liver ROS,   Endo/Other  negative endocrine ROS  Renal/GU negative Renal ROS  negative genitourinary   Musculoskeletal   Abdominal   Peds  Hematology negative hematology ROS (+)   Anesthesia Other Findings   Reproductive/Obstetrics (+) Pregnancy                           Anesthesia Physical Anesthesia Plan  ASA: II  Anesthesia Plan: Epidural   Post-op Pain Management:    Induction:   Airway Management Planned:   Additional Equipment:   Intra-op Plan:   Post-operative Plan:   Informed Consent: I have reviewed the patients History and Physical, chart, labs and discussed the procedure including the risks, benefits and alternatives for the proposed anesthesia with the patient or authorized representative who has indicated his/her understanding and acceptance.     Plan Discussed with:   Anesthesia Plan Comments:         Anesthesia Quick Evaluation

## 2012-09-24 LAB — CBC
Platelets: 98 10*3/uL — ABNORMAL LOW (ref 150–400)
RBC: 2.96 MIL/uL — ABNORMAL LOW (ref 3.87–5.11)
RDW: 13.3 % (ref 11.5–15.5)
WBC: 15.9 10*3/uL — ABNORMAL HIGH (ref 4.0–10.5)

## 2012-09-24 NOTE — Progress Notes (Signed)
Notified Cindy Rice of platelet count of 98. Ordered that motrin may still be given and CBC ordered for tomorrow morning.  Will continue to monitor.  Earl Gala, Linda Hedges Colbert

## 2012-09-24 NOTE — Progress Notes (Signed)
Post Partum Day 1 Subjective: no complaints, up ad lib, voiding, tolerating PO and + flatus  Objective: Blood pressure 113/79, pulse 90, temperature 97.9 F (36.6 C), temperature source Oral, resp. rate 18, height 5\' 5"  (1.651 m), weight 174 lb (78.926 kg), last menstrual period 12/15/2011, SpO2 99.00%, unknown if currently breastfeeding.  Physical Exam:  General: alert and cooperative Lochia: appropriate Uterine Fundus: firm Incision: perineum intact, no significant labial edema noted DVT Evaluation: No evidence of DVT seen on physical exam. Negative Homan's sign. No cords or calf tenderness. No significant calf/ankle edema.   Recent Labs  09/23/12 1930 09/24/12 0650  HGB 11.4* 8.9*  HCT 33.5* 25.8*    Assessment/Plan: Plan for discharge tomorrow   LOS: 1 day   CURTIS,CAROL G 09/24/2012, 8:13 AM

## 2012-09-25 LAB — CBC
Hemoglobin: 9 g/dL — ABNORMAL LOW (ref 12.0–15.0)
RBC: 2.97 MIL/uL — ABNORMAL LOW (ref 3.87–5.11)
WBC: 11.3 10*3/uL — ABNORMAL HIGH (ref 4.0–10.5)

## 2012-09-25 MED ORDER — IBUPROFEN 600 MG PO TABS: 600.0000 mg | ORAL_TABLET | Freq: Four times a day (QID) | ORAL | Status: DC

## 2012-09-25 MED ORDER — OXYCODONE-ACETAMINOPHEN 5-325 MG PO TABS: 1.0000 | ORAL_TABLET | ORAL | Status: DC | PRN

## 2012-09-25 NOTE — Lactation Note (Signed)
This note was copied from the chart of Cindy Vanassa Penn[place]. Lactation Consultation Note Mom states br feeding is going very well; states that her RN from yesterday helped her a lot and she feels very good with br feeding.  At this time, baby is awake and screaming. However, mom and dad state that baby is not hungry, b/c baby recently fed, but perhaps is cold. Reviewed cluster feeding, cue based feeding, and baby's small stomach size. Offered to assist with a feeding, mom repeated that baby is not hungry at this time and declines help. Enc. mom to call lactation office if she has any concerns, and to attend the BFS[facility]. Questions answered.   Patient Name: Cindy Rice [id number] Date: 09/25/2012 Reason for consult: Follow-up assessment   Maternal Data    Feeding Feeding Type:  (declines assist from lactation) Feeding method: Breast Length of feed: 15 min  LATCH Score/Interventions Latch: Grasps breast easily, tongue down, lips flanged, rhythmical sucking.  Audible Swallowing: A few with stimulation  Type of Nipple: Everted at rest and after stimulation  Comfort (Breast/Nipple): Soft / non-tender     Hold (Positioning): No assistance needed to correctly position infant at breast.  LATCH Score: 9  Lactation Tools Discussed/Used     Consult Status Consult Status: Complete    Lenard Forth 09/25/2012, 10:24 AM

## 2012-09-25 NOTE — Discharge Summary (Signed)
Obstetric Discharge Summary Reason for Admission: rupture of membranes Prenatal Procedures: ultrasound Intrapartum Procedures: spontaneous vaginal delivery and VE Postpartum Procedures: none Complications-Operative and Postpartum: 2 degree perineal laceration Hemoglobin  Date Value Range Status  09/25/2012 PENDING  12.0 - 15.0 g/dL Incomplete     HCT  Date Value Range Status  09/25/2012 PENDING  36.0 - 46.0 % Incomplete    Physical Exam:  General: alert and cooperative Lochia: appropriate Uterine Fundus: firm Incision: perineum intact DVT Evaluation: No evidence of DVT seen on physical exam. Negative Homan's sign. No cords or calf tenderness. Calf/Ankle edema is present.  Discharge Diagnoses: Term Pregnancy-delivered  Discharge Information: Date: 09/25/2012 Activity: pelvic rest Diet: routine Medications: PNV, Ibuprofen and Percocet Condition: stable Instructions: refer to practice specific booklet Discharge to: home  Reviewed signs and symptoms of PIH, Will have her RTO in week for BP check   Newborn Data: Live born female  Birth Weight: 8 lb 1.1 oz (3660 g) APGAR: 9, 9  Home with mother.  Cindy Rice G 09/25/2012, 8:36 AM

## 2012-09-26 NOTE — Anesthesia Postprocedure Evaluation (Signed)
Patient stable following vaginal delivery.  

## 2013-02-21 ENCOUNTER — Ambulatory Visit (INDEPENDENT_AMBULATORY_CARE_PROVIDER_SITE_OTHER): Payer: 59 | Admitting: Emergency Medicine

## 2013-02-21 VITALS — BP 128/88 | HR 86 | Temp 98.3°F | Resp 16 | Ht 64.5 in | Wt 146.0 lb

## 2013-02-21 DIAGNOSIS — R110 Nausea: Secondary | ICD-10-CM

## 2013-02-21 DIAGNOSIS — K1370 Unspecified lesions of oral mucosa: Secondary | ICD-10-CM

## 2013-02-21 DIAGNOSIS — K1379 Other lesions of oral mucosa: Secondary | ICD-10-CM

## 2013-02-21 MED ORDER — IBUPROFEN 600 MG PO TABS: 600.0000 mg | ORAL_TABLET | Freq: Four times a day (QID) | ORAL | Status: DC

## 2013-02-21 MED ORDER — ONDANSETRON 4 MG PO TBDP: 4.0000 mg | ORAL_TABLET | Freq: Three times a day (TID) | ORAL | Status: DC | PRN

## 2013-02-21 NOTE — Progress Notes (Signed)
Subjective:  This chart was scribed for Lesle Chris, MD by Carl Best, Medical Scribe. This patient was seen in Room 10 and the patient's care was started at 12:01 PM.    Patient ID: Cindy Rice, female    DOB: 01/23/1986, 27 y.o.   MRN: [id number]  HPI HPI Comments: Cindy Rice is a 27 y.o. female who presents to the [facility] needing ibuprofen after having her wisdom teeth extracted yesterday.  The patient states that she was prescribed Vicodin 7.5 and promethazine but she would like to take the prescription ibuprofen instead because she is currently breastfeeding her daughter.    Past Medical History  Diagnosis Date  . Anxiety   . Asthma     sports induced   Past Surgical History  Procedure Laterality Date  . Cholecystectomy     Family History  Problem Relation Age of Onset  . Other Neg Hx   . Hypertension Mother    History   Social History  . Marital Status: Married    Spouse Name: N/A    Number of Children: N/A  . Years of Education: N/A   Occupational History  . Not on file.   Social History Main Topics  . Smoking status: Former Smoker    Quit date: 07/01/2010  . Smokeless tobacco: Never Used  . Alcohol Use: Yes     Comment: occasional use  . Drug Use: No  . Sexual Activity: Yes    Birth Control/ Protection: None   Other Topics Concern  . Not on file   Social History Narrative  . No narrative on file   Current outpatient prescriptions:oxyCODONE-acetaminophen (PERCOCET/ROXICET) 5-325 MG per tablet, Take 1-2 tablets by mouth every 4 (four) hours as needed., Disp: 30 tablet, Rfl: 0;  Prenatal Vit-Fe Fumarate-FA (PRENATAL MULTIVITAMIN) TABS, Take 1 tablet by mouth daily at 12 noon., Disp: , Rfl: ;  promethazine (PHENERGAN) 25 MG tablet, Take 25 mg by mouth every 6 (six) hours as needed for nausea., Disp: , Rfl:  ibuprofen (ADVIL,MOTRIN) 600 MG tablet, Take 1 tablet (600 mg total) by mouth every 6 (six) hours., Disp: 30  tablet, Rfl: 1;  ondansetron (ZOFRAN-ODT) 4 MG disintegrating tablet, Take 1 tablet (4 mg total) by mouth every 8 (eight) hours as needed for nausea., Disp: 20 tablet, Rfl: 0  Filed Vitals:   02/21/13 1135  BP: 128/88  Pulse: 86  Temp: 98.3 F (36.8 C)  TempSrc: Oral  Resp: 16  Height: 5' 4.5" (1.638 m)  Weight: 146 lb (66.225 kg)  SpO2: 100%     Review of Systems  Gastrointestinal: Positive for nausea.  All other systems reviewed and are negative.       Objective:   Physical Exam  Nursing note and vitals reviewed. Constitutional: She is oriented to person, place, and time. She appears well-developed and well-nourished. No distress.  HENT:  Head: Normocephalic and atraumatic.  Evidence of extraction of the wisdom teeth and bilateral lower jaw.  Mandible appears normal.  There is no axillary node.  Minimal swelling noted.  Eyes: EOM are normal.  Neck: Neck supple. No tracheal deviation present.  Cardiovascular: Normal rate.   Pulmonary/Chest: Effort normal. No respiratory distress.  Musculoskeletal: Normal range of motion.  Neurological: She is alert and oriented to person, place, and time.  Skin: Skin is warm and dry.  Psychiatric: She has a normal mood and affect. Her behavior is normal.      DIAGNOSTIC STUDIES: Oxygen Saturation is  100% on room air, normal by my interpretation.    COORDINATION OF CARE: 11:49 AM- Discussed prescribing the patient with ibuprofen and zofran and the patient agreed to the treatment plan.         Assessment & Plan:   Problem List Items Addressed This Visit   None    Visit Diagnoses   Pain in mouth    -  Primary    Relevant Medications       ibuprofen (MOTRIN) tablet    Nausea alone        Relevant Medications       ondansetron (ZOFRAN-ODT) disintegrating tablet     Treat with ibuprofen and zofran because she is breast  feeding I personally performed the services described in this documentation, which was scribed in my  presence. The recorded information has been reviewed and is accurate.

## 2013-02-21 NOTE — Progress Notes (Signed)
  Subjective:    Patient ID: Cindy Rice, female    DOB: 04-26-1986, 27 y.o.   MRN: [id number]  HPI    Review of Systems     Objective:   Physical Exam        Assessment & Plan:

## 2013-03-01 ENCOUNTER — Ambulatory Visit (INDEPENDENT_AMBULATORY_CARE_PROVIDER_SITE_OTHER): Payer: 59 | Admitting: Family Medicine

## 2013-03-01 VITALS — BP 122/76 | HR 122 | Temp 98.4°F | Resp 17 | Ht 65.5 in | Wt 147.0 lb

## 2013-03-01 DIAGNOSIS — K1379 Other lesions of oral mucosa: Secondary | ICD-10-CM

## 2013-03-01 DIAGNOSIS — K089 Disorder of teeth and supporting structures, unspecified: Secondary | ICD-10-CM

## 2013-03-01 MED ORDER — IBUPROFEN 600 MG PO TABS: 600.0000 mg | ORAL_TABLET | Freq: Four times a day (QID) | ORAL | Status: DC

## 2013-03-01 MED ORDER — HYDROCODONE-ACETAMINOPHEN 7.5-325 MG PO TABS: 1.0000 | ORAL_TABLET | Freq: Four times a day (QID) | ORAL | Status: DC | PRN

## 2013-03-01 MED ORDER — AMOXICILLIN 875 MG PO TABS: 875.0000 mg | ORAL_TABLET | Freq: Two times a day (BID) | ORAL | Status: DC

## 2013-03-01 NOTE — Progress Notes (Signed)
This chart was scribed for Norberto Sorenson, MD by Ardelia Mems, Scribe. This patient was seen in room Room 9 and the patient's care was started at 9:48 M.  Subjective:    Patient ID: Cindy Rice, female    DOB: December 17, 1985, 27 y.o.   MRN: [id number]  Chief Complaint  Patient presents with  . Dental Injury    pain post surgery     HPI  HPI Comments: Cindy Rice is a 27 y.o. female who presents to [facility] complaining of persistent dental pain after a recent wisdom teeth extraction surgery 8-9 days ago. She describes her pain as "throbbing" and states that her pain is worst in the morning. She states that she has an associated generalized headache. She was seen about 1 week ago at the clinic for this pain and requested a prescription for Ibuprofen. She was prescribed Vicodin 7.5 mg and Promethazine, but didn't want to take these medications because she is breast feeding. She had dry sockets, and her dentist also told her that she had a cyst behind one of her wisdom teeth. She expresses concern that she may have a dental infection. She states that she has an follow-up appointment with her oral surgeon tomorrow. She states that she ran out of her prescribed Ibuprofen and vicodin. She states that she cannot take Penicillin due to HA, but that she can take Amoxicillin.   Past Medical History  Diagnosis Date  . Anxiety   . Asthma     sports induced   Current Outpatient Prescriptions on File Prior to Visit  Medication Sig Dispense Refill  . Prenatal Vit-Fe Fumarate-FA (PRENATAL MULTIVITAMIN) TABS Take 1 tablet by mouth daily at 12 noon.      . promethazine (PHENERGAN) 25 MG tablet Take 25 mg by mouth every 6 (six) hours as needed for nausea.      . ondansetron (ZOFRAN-ODT) 4 MG disintegrating tablet Take 1 tablet (4 mg total) by mouth every 8 (eight) hours as needed for nausea.  20 tablet  0   No current facility-administered medications on file prior to visit.    Allergies  Allergen Reactions  . Penicillins Other (See Comments)    Migraines   . Ciprofloxacin Nausea And Vomiting    Review of Systems  Constitutional: Positive for activity change and fatigue. Negative for fever and chills.  HENT: Positive for dental problem, facial swelling and mouth sores.   Respiratory: Negative for shortness of breath.   Cardiovascular: Negative for chest pain and palpitations.  Gastrointestinal: Negative for nausea and vomiting.  Neurological: Positive for headaches.  Hematological: Positive for adenopathy. Does not bruise/bleed easily.      BP 122/76  Pulse 122  Temp(Src) 98.4 F (36.9 C) (Oral)  Resp 17  Ht 5' 5.5" (1.664 m)  Wt 147 lb (66.679 kg)  BMI 24.08 kg/m2  SpO2 97%  Breastfeeding? Yes Objective:   Physical Exam  Nursing note and vitals reviewed. Constitutional: She is oriented to person, place, and time. She appears well-developed and well-nourished. No distress.  HENT:  Head: Normocephalic and atraumatic.  Halitosis. Edema, erythema and hypersensitvity around wisdom teeth surgical sites, all of which are worse on the right than the left.  Eyes: EOM are normal.  Neck: Neck supple. No tracheal deviation present.  Cardiovascular: Normal rate.   Pulmonary/Chest: Effort normal. No respiratory distress.  Musculoskeletal: Normal range of motion.  Lymphadenopathy:       Head (right side): Submandibular adenopathy present.  Head (left side): Submandibular adenopathy present.    She has cervical adenopathy.       Right cervical: Superficial cervical adenopathy present.       Left cervical: Superficial cervical adenopathy present.  Anterior cervical adenopathy and submandibular adenopathy are worse on the right than the left.   Neurological: She is alert and oriented to person, place, and time.  Skin: Skin is warm and dry.  Psychiatric: She has a normal mood and affect. Her behavior is normal.       Assessment & Plan:   Dental  disorder  Pain in mouth - Plan: ibuprofen (ADVIL,MOTRIN) 600 MG tablet Concern for infection due to prolonged swelling and extreme hypersenstivity at surgical sites so will go ahead and cover w/ amoxicillin and refill pain meds. Pt has appt to f/u w/ dentist tomorrow.  Pt reports penicillin allergy is "headache" and does not have any allergy to amox. Pt is breastfeeding. Meds ordered this encounter  Medications  . ibuprofen (ADVIL,MOTRIN) 600 MG tablet    Sig: Take 1 tablet (600 mg total) by mouth every 6 (six) hours.    Dispense:  30 tablet    Refill:  1  . HYDROcodone-acetaminophen (NORCO) 7.5-325 MG per tablet    Sig: Take 1 tablet by mouth every 6 (six) hours as needed for pain.    Dispense:  30 tablet    Refill:  0  . amoxicillin (AMOXIL) 875 MG tablet    Sig: Take 1 tablet (875 mg total) by mouth 2 (two) times daily.    Dispense:  20 tablet    Refill:  0    I personally performed the services described in this documentation, which was scribed in my presence. The recorded information has been reviewed and considered, and addended by me as needed.  Norberto Sorenson, MD MPH

## 2013-03-05 ENCOUNTER — Other Ambulatory Visit: Payer: Self-pay

## 2013-06-27 ENCOUNTER — Ambulatory Visit (INDEPENDENT_AMBULATORY_CARE_PROVIDER_SITE_OTHER): Payer: 59 | Admitting: Emergency Medicine

## 2013-06-27 ENCOUNTER — Telehealth: Payer: Self-pay

## 2013-06-27 VITALS — BP 102/70 | HR 100 | Temp 97.9°F | Resp 16 | Ht 65.0 in | Wt 146.8 lb

## 2013-06-27 DIAGNOSIS — J209 Acute bronchitis, unspecified: Secondary | ICD-10-CM

## 2013-06-27 DIAGNOSIS — J018 Other acute sinusitis: Secondary | ICD-10-CM

## 2013-06-27 MED ORDER — CEFPROZIL 500 MG PO TABS: 500.0000 mg | ORAL_TABLET | Freq: Two times a day (BID) | ORAL | Status: DC

## 2013-06-27 MED ORDER — GUAIFENESIN-CODEINE 100-10 MG/5ML PO SOLN: 5.0000 mL | Freq: Three times a day (TID) | ORAL | Status: DC | PRN

## 2013-06-27 NOTE — Progress Notes (Signed)
[facility] [address], [place] [address] [phone]  Date:  06/27/2013   Name:  Cindy Rice   DOB:  1986/01/20   MRN:  [id number]  PCP:  No PCP Per Patient    Chief Complaint: Sore Throat, Head Congestion, Cough and Tailbone Pain   History of Present Illness:  Cindy Rice is a 27 y.o. very pleasant female patient who presents with the following:  Ill 3 days with purulent nasal drainage and post nasal drainage.  Chilled no fever.  Sore throat.  Non productive cough.  No wheezing or shortness of breath worse at night.  Works in Dance movement psychotherapist.  No improvement with over the counter medications or other home remedies. Denies other complaint or health concern today.   There are no active problems to display for this patient.   Past Medical History  Diagnosis Date  . Anxiety   . Asthma     sports induced    Past Surgical History  Procedure Laterality Date  . Cholecystectomy      History  Substance Use Topics  . Smoking status: Former Smoker    Quit date: 07/01/2010  . Smokeless tobacco: Never Used  . Alcohol Use: Yes     Comment: occasional use    Family History  Problem Relation Age of Onset  . Other Neg Hx   . Hypertension Mother     Allergies  Allergen Reactions  . Penicillins Other (See Comments)    Migraines   . Ciprofloxacin Nausea And Vomiting    Medication list has been reviewed and updated.  Current Outpatient Prescriptions on File Prior to Visit  Medication Sig Dispense Refill  . ibuprofen (ADVIL,MOTRIN) 600 MG tablet Take 1 tablet (600 mg total) by mouth every 6 (six) hours.  30 tablet  1  . amoxicillin (AMOXIL) 875 MG tablet Take 1 tablet (875 mg total) by mouth 2 (two) times daily.  20 tablet  0  . HYDROcodone-acetaminophen (NORCO) 7.5-325 MG per tablet Take 1 tablet by mouth every 6 (six) hours as needed for pain.  30 tablet  0  . ondansetron (ZOFRAN-ODT) 4 MG disintegrating tablet Take 1 tablet (4  mg total) by mouth every 8 (eight) hours as needed for nausea.  20 tablet  0  . Prenatal Vit-Fe Fumarate-FA (PRENATAL MULTIVITAMIN) TABS Take 1 tablet by mouth daily at 12 noon.      . promethazine (PHENERGAN) 25 MG tablet Take 25 mg by mouth every 6 (six) hours as needed for nausea.       No current facility-administered medications on file prior to visit.    Review of Systems:  As per HPI, otherwise negative.    Physical Examination: Filed Vitals:   06/27/13 0930  BP: 102/70  Pulse: 100  Temp: 97.9 F (36.6 C)  Resp: 16   Filed Vitals:   06/27/13 0930  Height: 5\' 5"  (1.651 m)  Weight: 146 lb 12.8 oz (66.588 kg)   Body mass index is 24.43 kg/(m^2). Ideal Body Weight: Weight in (lb) to have BMI = 25: 149.9  GEN: WDWN, NAD, Non-toxic, A & O x 3 HEENT: Atraumatic, Normocephalic. Neck supple. No masses, No LAD. Ears and Nose: No external deformity. CV: RRR, No M/G/R. No JVD. No thrill. No extra heart sounds. PULM: CTA B, no wheezes, crackles, rhonchi. No retractions. No resp. distress. No accessory muscle use. ABD: S, NT, ND, +BS. No rebound. No HSM. EXTR: No c/c/e NEURO Normal gait.  PSYCH: Normally interactive. Conversant. Not depressed or anxious appearing.  Calm demeanor.    Assessment and Plan: Sinusitis cefzil mucinex d  Signed,  Phillips OdorJeffery Rinda Rollyson, MD

## 2013-06-27 NOTE — Patient Instructions (Signed)
Sinusitis  Sinusitis is redness, soreness, and swelling (inflammation) of the paranasal sinuses. Paranasal sinuses are air pockets within the bones of your face (beneath the eyes, the middle of the forehead, or above the eyes). In healthy paranasal sinuses, mucus is able to drain out, and air is able to circulate through them by way of your nose. However, when your paranasal sinuses are inflamed, mucus and air can become trapped. This can allow bacteria and other germs to grow and cause infection.  Sinusitis can develop quickly and last only a short time (acute) or continue over a long period (chronic). Sinusitis that lasts for more than 12 weeks is considered chronic.   CAUSES   Causes of sinusitis include:   Allergies.   Structural abnormalities, such as displacement of the cartilage that separates your nostrils (deviated septum), which can decrease the air flow through your nose and sinuses and affect sinus drainage.   Functional abnormalities, such as when the small hairs (cilia) that line your sinuses and help remove mucus do not work properly or are not present.  SYMPTOMS   Symptoms of acute and chronic sinusitis are the same. The primary symptoms are pain and pressure around the affected sinuses. Other symptoms include:   Upper toothache.   Earache.   Headache.   Bad breath.   Decreased sense of smell and taste.   A cough, which worsens when you are lying flat.   Fatigue.   Fever.   Thick drainage from your nose, which often is green and may contain pus (purulent).   Swelling and warmth over the affected sinuses.  DIAGNOSIS   Your caregiver will perform a physical exam. During the exam, your caregiver may:   Look in your nose for signs of abnormal growths in your nostrils (nasal polyps).   Tap over the affected sinus to check for signs of infection.   View the inside of your sinuses (endoscopy) with a special imaging device with a light attached (endoscope), which is inserted into your  sinuses.  If your caregiver suspects that you have chronic sinusitis, one or more of the following tests may be recommended:   Allergy tests.   Nasal culture A sample of mucus is taken from your nose and sent to a lab and screened for bacteria.   Nasal cytology A sample of mucus is taken from your nose and examined by your caregiver to determine if your sinusitis is related to an allergy.  TREATMENT   Most cases of acute sinusitis are related to a viral infection and will resolve on their own within 10 days. Sometimes medicines are prescribed to help relieve symptoms (pain medicine, decongestants, nasal steroid sprays, or saline sprays).   However, for sinusitis related to a bacterial infection, your caregiver will prescribe antibiotic medicines. These are medicines that will help kill the bacteria causing the infection.   Rarely, sinusitis is caused by a fungal infection. In theses cases, your caregiver will prescribe antifungal medicine.  For some cases of chronic sinusitis, surgery is needed. Generally, these are cases in which sinusitis recurs more than 3 times per year, despite other treatments.  HOME CARE INSTRUCTIONS    Drink plenty of water. Water helps thin the mucus so your sinuses can drain more easily.   Use a humidifier.   Inhale steam 3 to 4 times a day (for example, sit in the bathroom with the shower running).   Apply a warm, moist washcloth to your face 3 to 4 times a day,   or as directed by your caregiver.   Use saline nasal sprays to help moisten and clean your sinuses.   Take over-the-counter or prescription medicines for pain, discomfort, or fever only as directed by your caregiver.  SEEK IMMEDIATE MEDICAL CARE IF:   You have increasing pain or severe headaches.   You have nausea, vomiting, or drowsiness.   You have swelling around your face.   You have vision problems.   You have a stiff neck.   You have difficulty breathing.  MAKE SURE YOU:    Understand these  instructions.   Will watch your condition.   Will get help right away if you are not doing well or get worse.  Document Released: 04/16/2005 Document Revised: 07/09/2011 Document Reviewed: 05/01/2011  [employer] Patient Information 2014 [employer]          Bronchitis  Bronchitis is inflammation of the airways that extend from the windpipe into the lungs (bronchi). The inflammation often causes mucus to develop, which leads to a cough. If the inflammation becomes severe, it may cause shortness of breath.  CAUSES   Bronchitis may be caused by:    Viral infections.    Bacteria.    Cigarette smoke.    Allergens, pollutants, and other irritants.   SIGNS AND SYMPTOMS   The most common symptom of bronchitis is a frequent cough that produces mucus. Other symptoms include:   Fever.    Body aches.    Chest congestion.    Chills.    Shortness of breath.    Sore throat.   DIAGNOSIS   Bronchitis is usually diagnosed through a medical history and physical exam. Tests, such as chest X-rays, are sometimes done to rule out other conditions.   TREATMENT   You may need to avoid contact with whatever caused the problem (smoking, for example). Medicines are sometimes needed. These may include:   Antibiotics. These may be prescribed if the condition is caused by bacteria.   Cough suppressants. These may be prescribed for relief of cough symptoms.    Inhaled medicines. These may be prescribed to help open your airways and make it easier for you to breathe.    Steroid medicines. These may be prescribed for those with recurrent (chronic) bronchitis.  HOME CARE INSTRUCTIONS   Get plenty of rest.    Drink enough fluids to keep your urine clear or pale yellow (unless you have a medical condition that requires fluid restriction). Increasing fluids may help thin your secretions and will prevent dehydration.    Only take over-the-counter or prescription medicines as directed by your health care  provider.   Only take antibiotics as directed. Make sure you finish them even if you start to feel better.   Avoid secondhand smoke, irritating chemicals, and strong fumes. These will make bronchitis worse. If you are a smoker, quit smoking. Consider using nicotine gum or skin patches to help control withdrawal symptoms. Quitting smoking will help your lungs heal faster.    Put a cool-mist humidifier in your bedroom at night to moisten the air. This may help loosen mucus. Change the water in the humidifier daily. You can also run the hot water in your shower and sit in the bathroom with the door closed for 5 10 minutes.    Follow up with your health care provider as directed.    Wash your hands frequently to avoid catching bronchitis again or spreading an infection to others.   SEEK MEDICAL CARE IF:  Your symptoms   do not improve after 1 week of treatment.   SEEK IMMEDIATE MEDICAL CARE IF:   Your fever increases.   You have chills.    You have chest pain.    You have worsening shortness of breath.    You have bloody sputum.   You faint.   You have lightheadedness.   You have a severe headache.    You vomit repeatedly.  MAKE SURE YOU:    Understand these instructions.   Will watch your condition.   Will get help right away if you are not doing well or get worse.  Document Released: 04/16/2005 Document Revised: 02/04/2013 Document Reviewed: 12/09/2012  [employer] Patient Information 2014 [employer]

## 2013-06-27 NOTE — Telephone Encounter (Signed)
Patient was seen by Dr. Dareen PianoAnderson today and she was prescribed an antibiotic that the Dr. Catalina Pizzaold her she needs to start taking right away. The problem is that her pharmacy [facility] and all the ones in town are out of cefPROZIL (CEFZIL) 500 MG tablet; they wont have this medication in stock till Tuesday.  She wants to switch to a pharmacy that has it in stock [facility]. Please send there if possible. Thank you!  Best [phone]

## 2013-06-29 ENCOUNTER — Encounter (HOSPITAL_BASED_OUTPATIENT_CLINIC_OR_DEPARTMENT_OTHER): Payer: Self-pay | Admitting: Emergency Medicine

## 2013-06-29 ENCOUNTER — Emergency Department (HOSPITAL_BASED_OUTPATIENT_CLINIC_OR_DEPARTMENT_OTHER)
Admission: EM | Admit: 2013-06-29 | Discharge: 2013-06-29 | Disposition: A | Discharge status: Home / Self Care | Payer: 59 | Attending: Emergency Medicine | Admitting: Emergency Medicine

## 2013-06-29 DIAGNOSIS — Z791 Long term (current) use of non-steroidal anti-inflammatories (NSAID): Secondary | ICD-10-CM | POA: Insufficient documentation

## 2013-06-29 DIAGNOSIS — Z792 Long term (current) use of antibiotics: Secondary | ICD-10-CM | POA: Insufficient documentation

## 2013-06-29 DIAGNOSIS — Z3202 Encounter for pregnancy test, result negative: Secondary | ICD-10-CM | POA: Insufficient documentation

## 2013-06-29 DIAGNOSIS — K2970 Gastritis, unspecified, without bleeding: Secondary | ICD-10-CM | POA: Insufficient documentation

## 2013-06-29 DIAGNOSIS — K2990 Gastroduodenitis, unspecified, without bleeding: Principal | ICD-10-CM

## 2013-06-29 DIAGNOSIS — J45909 Unspecified asthma, uncomplicated: Secondary | ICD-10-CM | POA: Insufficient documentation

## 2013-06-29 DIAGNOSIS — Z9089 Acquired absence of other organs: Secondary | ICD-10-CM | POA: Insufficient documentation

## 2013-06-29 DIAGNOSIS — Z880 Allergy status to penicillin: Secondary | ICD-10-CM | POA: Insufficient documentation

## 2013-06-29 DIAGNOSIS — Z79899 Other long term (current) drug therapy: Secondary | ICD-10-CM | POA: Insufficient documentation

## 2013-06-29 DIAGNOSIS — Z87891 Personal history of nicotine dependence: Secondary | ICD-10-CM | POA: Insufficient documentation

## 2013-06-29 DIAGNOSIS — Z8659 Personal history of other mental and behavioral disorders: Secondary | ICD-10-CM | POA: Insufficient documentation

## 2013-06-29 LAB — LIPASE, BLOOD: Lipase: 31 U/L (ref 11–59)

## 2013-06-29 LAB — COMPREHENSIVE METABOLIC PANEL
ALT: 36 U/L — AB (ref 0–35)
AST: 78 U/L — AB (ref 0–37)
Albumin: 4.4 g/dL (ref 3.5–5.2)
Alkaline Phosphatase: 116 U/L (ref 39–117)
BILIRUBIN TOTAL: 0.3 mg/dL (ref 0.3–1.2)
BUN: 20 mg/dL (ref 6–23)
CHLORIDE: 103 meq/L (ref 96–112)
CO2: 26 meq/L (ref 19–32)
Calcium: 9.7 mg/dL (ref 8.4–10.5)
Creatinine, Ser: 0.6 mg/dL (ref 0.50–1.10)
GFR calc Af Amer: >90 mL/min (ref >90)
Glucose, Bld: 94 mg/dL (ref 70–99)
Potassium: 4.2 mEq/L (ref 3.7–5.3)
SODIUM: 143 meq/L (ref 137–147)
Total Protein: 7.5 g/dL (ref 6.0–8.3)

## 2013-06-29 LAB — CBC WITH DIFFERENTIAL/PLATELET
BASOS ABS: 0 10*3/uL (ref 0.0–0.1)
Basophils Relative: 0 % (ref 0–1)
Eosinophils Absolute: 0.1 10*3/uL (ref 0.0–0.7)
Eosinophils Relative: 1 % (ref 0–5)
HEMATOCRIT: 40.6 % (ref 36.0–46.0)
Hemoglobin: 13.9 g/dL (ref 12.0–15.0)
LYMPHS PCT: 36 % (ref 12–46)
Lymphs Abs: 2.4 10*3/uL (ref 0.7–4.0)
MCH: 29 pg (ref 26.0–34.0)
MCHC: 34.2 g/dL (ref 30.0–36.0)
MCV: 84.6 fL (ref 78.0–100.0)
Monocytes Absolute: 0.6 10*3/uL (ref 0.1–1.0)
Monocytes Relative: 8 % (ref 3–12)
NEUTROS ABS: 3.7 10*3/uL (ref 1.7–7.7)
NEUTROS PCT: 55 % (ref 43–77)
PLATELETS: 176 10*3/uL (ref 150–400)
RBC: 4.8 MIL/uL (ref 3.87–5.11)
RDW: 13.1 % (ref 11.5–15.5)
WBC: 6.7 10*3/uL (ref 4.0–10.5)

## 2013-06-29 LAB — URINALYSIS, ROUTINE W REFLEX MICROSCOPIC
Bilirubin Urine: NEGATIVE
Glucose, UA: NEGATIVE mg/dL
HGB URINE DIPSTICK: NEGATIVE
Ketones, ur: 15 mg/dL — AB
Leukocytes, UA: NEGATIVE
NITRITE: NEGATIVE
Protein, ur: NEGATIVE mg/dL
SPECIFIC GRAVITY, URINE: 1.031 — AB (ref 1.005–1.030)
UROBILINOGEN UA: 0.2 mg/dL (ref 0.0–1.0)
pH: 7 (ref 5.0–8.0)

## 2013-06-29 LAB — PREGNANCY, URINE: PREG TEST UR: NEGATIVE

## 2013-06-29 MED ORDER — ALUM & MAG HYDROXIDE-SIMETH 200-200-20 MG/5ML PO SUSP
30.0000 mL | Freq: Once | ORAL | Status: AC
Start: 2013-06-29 — End: 2013-06-29
  Administered 2013-06-29: 30 mL via ORAL
  Filled 2013-06-29: qty 30

## 2013-06-29 MED ORDER — PANTOPRAZOLE SODIUM 20 MG PO TBEC: 20.0000 mg | DELAYED_RELEASE_TABLET | Freq: Every day | ORAL | Status: DC

## 2013-06-29 MED ORDER — HYDROCODONE-ACETAMINOPHEN 7.5-325 MG PO TABS: 1.0000 | ORAL_TABLET | Freq: Four times a day (QID) | ORAL | Status: DC | PRN

## 2013-06-29 MED ORDER — CEFPROZIL 500 MG PO TABS: 500.0000 mg | ORAL_TABLET | Freq: Two times a day (BID) | ORAL | Status: AC

## 2013-06-29 NOTE — Discharge Instructions (Signed)

## 2013-06-29 NOTE — ED Notes (Addendum)
Abdominal pain x 45 minutes that is constant but gets worse. 2 days ago she was started on Cefzil for a sinus infection. She is currently breast feeding.

## 2013-06-29 NOTE — Telephone Encounter (Signed)
Pt CB and LM when she got my message about her Abx. She stated that she was able to get her Abx on Sat from [facility], but that the cough med that was sent in for her is causing her nausea and extreme tiredness. She requests that a Rx for Hydromet be written if possible. She has taken this in the past and seems to be effective w/out the SEs.

## 2013-06-29 NOTE — Telephone Encounter (Signed)
[facility] and was [facility]. [facility] and notified pt on VM.

## 2013-06-29 NOTE — ED Provider Notes (Signed)
CSN: [id number]     Arrival date & time 06/29/13  2113 History  This chart was scribed for Charles B. Bernette MayersSheldon, MD by Beverly MilchJ Harrison Collins, ED Scribe. This patient was seen in room MH08/MH08 and the patient's care was started at 11:27 PM.    Chief Complaint  Patient presents with  . Abdominal Pain      The history is provided by the patient. No language interpreter was used.   HPI Comments: Cindy Rice is a 28 y.o. female with a history of cholecystectomy in June of 2013 who presents to the Emergency Department complaining of constant, stabbing, epigastric abdominal pain that began suddenly about 8:30 PM while she was playing with her daughter. Pt reports that the onset of the pain was an hour and a half or so after eating. She also reports the pain was so severe it made her short of breath. She states the pain felt like pressure in her epigastric region and was similar to pain she experienced prior to her cholecystectomy in 2013. Pt states she's had similar pain occasionally since her cholecystectomy. Pt reports increased BM frequency with some diarrhea and nausea. Pt denies emesis, hematochezia, melena, dysuria, and vaginal discharge and bleeding. Patient denies h/o DM. She is currently breast feeding. Pt drinks alcohol occasionally and never smokes.   Past Medical History  Diagnosis Date  . Anxiety   . Asthma     sports induced    Past Surgical History  Procedure Laterality Date  . Cholecystectomy      Family History  Problem Relation Age of Onset  . Other Neg Hx   . Hypertension Mother     History  Substance Use Topics  . Smoking status: Former Smoker    Quit date: 07/01/2010  . Smokeless tobacco: Never Used  . Alcohol Use: Yes     Comment: occasional use    OB History   Grav Para Term Preterm Abortions TAB SAB Ect Mult Living   2 1 1  1  1    0     Review of Systems A complete 10 system review of systems was obtained and all systems are negative except as  noted in the HPI and PMH.     Allergies  Penicillins and Ciprofloxacin  Home Medications   Current Outpatient Rx  Name  Route  Sig  Dispense  Refill  . amoxicillin (AMOXIL) 875 MG tablet   Oral   Take 1 tablet (875 mg total) by mouth 2 (two) times daily.   20 tablet   0   . cefPROZIL (CEFZIL) 500 MG tablet   Oral   Take 1 tablet (500 mg total) by mouth 2 (two) times daily.   20 tablet   0   . guaiFENesin-codeine 100-10 MG/5ML syrup   Oral   Take 5-10 mLs by mouth 3 (three) times daily as needed for cough.   120 mL   0   . HYDROcodone-acetaminophen (NORCO) 7.5-325 MG per tablet   Oral   Take 1 tablet by mouth every 6 (six) hours as needed for pain.   30 tablet   0   . ibuprofen (ADVIL,MOTRIN) 600 MG tablet   Oral   Take 1 tablet (600 mg total) by mouth every 6 (six) hours.   30 tablet   1   . norethindrone (MICRONOR,CAMILA,ERRIN) 0.35 MG tablet   Oral   Take 1 tablet by mouth daily.         . ondansetron (ZOFRAN-ODT) 4  MG disintegrating tablet   Oral   Take 1 tablet (4 mg total) by mouth every 8 (eight) hours as needed for nausea.   20 tablet   0   . Prenatal Vit-Fe Fumarate-FA (PRENATAL MULTIVITAMIN) TABS   Oral   Take 1 tablet by mouth daily at 12 noon.         . promethazine (PHENERGAN) 25 MG tablet   Oral   Take 25 mg by mouth every 6 (six) hours as needed for nausea.          Triage Vitals: BP 125/91  Pulse 96  Temp(Src) 97.7 F (36.5 C) (Oral)  Resp 18  Ht 5\' 5"  (1.651 m)  Wt 140 lb (63.504 kg)  BMI 23.30 kg/m2  SpO2 100%  LMP 04/30/2013  Physical Exam  Nursing note and vitals reviewed. Constitutional: She is oriented to person, place, and time. She appears well-developed and well-nourished.  HENT:  Head: Normocephalic and atraumatic.  Eyes: EOM are normal. Pupils are equal, round, and reactive to light.  Neck: Normal range of motion. Neck supple.  Cardiovascular: Normal rate, normal heart sounds and intact distal pulses.    Pulmonary/Chest: Effort normal and breath sounds normal.  Abdominal: Bowel sounds are normal. She exhibits no distension. There is tenderness (epigatric). There is no rebound and no guarding.  Musculoskeletal: Normal range of motion. She exhibits no edema and no tenderness.  Neurological: She is alert and oriented to person, place, and time. She has normal strength. No cranial nerve deficit or sensory deficit.  Skin: Skin is warm and dry. No rash noted.  Psychiatric: She has a normal mood and affect.    ED Course  Procedures (including critical care time)  DIAGNOSTIC STUDIES: Oxygen Saturation is 100% on RA, normal by my interpretation.    COORDINATION OF CARE: 11:38 PM- Pt advised of plan for treatment and pt agrees.    Labs Review Labs Reviewed  URINALYSIS, ROUTINE W REFLEX MICROSCOPIC - Abnormal; Notable for the following:    Specific Gravity, Urine 1.031 (*)    Ketones, ur 15 (*)    All other components within normal limits  COMPREHENSIVE METABOLIC PANEL - Abnormal; Notable for the following:    AST 78 (*)    ALT 36 (*)    All other components within normal limits  PREGNANCY, URINE  CBC WITH DIFFERENTIAL  LIPASE, BLOOD   Imaging Review No results found.   EKG Interpretation None      MDM   Final diagnoses:  Gastritis   Mild epigastric tenderness without peritoneal signs, does not have a gall bladder. Plan PPI, pain meds as needed. She will avoid breastfeeding while taking meds. PCP or ED followup if not improving.   I personally performed the services described in this documentation, which was scribed in my presence. The recorded information has been reviewed and is accurate.      Charles B. Bernette Mayers, MD 06/29/13 2348

## 2013-06-30 NOTE — Telephone Encounter (Signed)
I see no prescription cough medicine sent for patient in the chart.  She can try some robitussin DM.

## 2013-07-02 NOTE — Telephone Encounter (Signed)
Called Cindy Rice and apologized that I was out of the office d/t family emerg and did not get Dr Ewell PoeAnderson's message to advise her to take OTC robitussin. Cindy Rice stated that it was fine, the Abx helped and she is doing much better.

## 2013-07-11 ENCOUNTER — Ambulatory Visit (INDEPENDENT_AMBULATORY_CARE_PROVIDER_SITE_OTHER): Payer: 59 | Admitting: Emergency Medicine

## 2013-07-11 VITALS — BP 112/86 | HR 88 | Temp 98.4°F | Resp 18 | Ht 65.0 in | Wt 143.0 lb

## 2013-07-11 DIAGNOSIS — R1013 Epigastric pain: Secondary | ICD-10-CM

## 2013-07-11 LAB — POCT CBC
GRANULOCYTE PERCENT: 64.1 % (ref 37–80)
HCT, POC: 45 % (ref 37.7–47.9)
Hemoglobin: 14.8 g/dL (ref 12.2–16.2)
Lymph, poc: 2.1 (ref 0.6–3.4)
MCH: 28.7 pg (ref 27–31.2)
MCHC: 32.9 g/dL (ref 31.8–35.4)
MCV: 87.3 fL (ref 80–97)
MID (CBC): 0.4 (ref 0–0.9)
MPV: 9.4 fL (ref 0–99.8)
PLATELET COUNT, POC: 279 10*3/uL (ref 142–424)
POC Granulocyte: 4.5 (ref 2–6.9)
POC LYMPH %: 30.3 % (ref 10–50)
POC MID %: 5.6 % (ref 0–12)
RBC: 5.16 M/uL (ref 4.04–5.48)
RDW, POC: 14.4 %
WBC: 7 10*3/uL (ref 4.6–10.2)

## 2013-07-11 LAB — COMPREHENSIVE METABOLIC PANEL
ALK PHOS: 86 U/L (ref 39–117)
ALT: 14 U/L (ref 0–35)
AST: 17 U/L (ref 0–37)
Albumin: 4.7 g/dL (ref 3.5–5.2)
BUN: 12 mg/dL (ref 6–23)
CALCIUM: 9.6 mg/dL (ref 8.4–10.5)
CHLORIDE: 108 meq/L (ref 96–112)
CO2: 24 mEq/L (ref 19–32)
CREATININE: 0.58 mg/dL (ref 0.50–1.10)
Glucose, Bld: 98 mg/dL (ref 70–99)
Potassium: 4.2 mEq/L (ref 3.5–5.3)
Sodium: 140 mEq/L (ref 135–145)
Total Bilirubin: 0.5 mg/dL (ref 0.2–1.2)
Total Protein: 7.1 g/dL (ref 6.0–8.3)

## 2013-07-11 LAB — IFOBT (OCCULT BLOOD): IFOBT: NEGATIVE

## 2013-07-11 LAB — LIPASE: Lipase: <10 U/L (ref 0–75)

## 2013-07-11 LAB — AMYLASE: Amylase: 29 U/L (ref 0–105)

## 2013-07-11 MED ORDER — SUCRALFATE 1 G PO TABS: ORAL_TABLET | ORAL | Status: DC

## 2013-07-11 MED ORDER — DEXLANSOPRAZOLE 60 MG PO CPDR: 60.0000 mg | DELAYED_RELEASE_CAPSULE | Freq: Every day | ORAL | Status: DC

## 2013-07-11 MED ORDER — HYDROCODONE-ACETAMINOPHEN 5-325 MG PO TABS: 1.0000 | ORAL_TABLET | ORAL | Status: DC | PRN

## 2013-07-11 NOTE — Progress Notes (Signed)
[facility] [address], [place] [address] [phone]  Date:  07/11/2013   Name:  Cindy Rice   DOB:  Nov 30, 1985   MRN:  [id number]  PCP:  No PCP Per Patient    Chief Complaint: Abdominal Pain   History of Present Illness:  Cindy Rice is a 28 y.o. very pleasant female patient who presents with the following:  Complicated history of abdominal complaints.  Had a cholecystectomy with complications and 2 ERCP's and a residual bile leak.  Seen in ER for epigastric pain and treated with protonix and vicodin.  Out of vicodin.  Scheduled for CT Monday and GI consult Thursday.  protonix not helping.  Has pain in abdomen with eating and feels bloated.  Poor appetite.  No fever or chills. Thought she might have melena.  No fever or chills.  No waterbrash or "heartburn"..  No improvement with over the counter medications or other home remedies. Denies other complaint or health concern today.   There are no active problems to display for this patient.   Past Medical History  Diagnosis Date  . Anxiety   . Asthma     sports induced    Past Surgical History  Procedure Laterality Date  . Cholecystectomy      History  Substance Use Topics  . Smoking status: Former Smoker    Quit date: 07/01/2010  . Smokeless tobacco: Never Used  . Alcohol Use: Yes     Comment: occasional use    Family History  Problem Relation Age of Onset  . Other Neg Hx   . Hypertension Mother     Allergies  Allergen Reactions  . Penicillins Other (See Comments)    Migraines   . Ciprofloxacin Nausea And Vomiting    Medication list has been reviewed and updated.  Current Outpatient Prescriptions on File Prior to Visit  Medication Sig Dispense Refill  . norethindrone (MICRONOR,CAMILA,ERRIN) 0.35 MG tablet Take 1 tablet by mouth daily.      . pantoprazole (PROTONIX) 20 MG tablet Take 1 tablet (20 mg total) by mouth daily.  30 tablet  0  .  HYDROcodone-acetaminophen (NORCO) 7.5-325 MG per tablet Take 1 tablet by mouth every 6 (six) hours as needed.  15 tablet  0  . [DISCONTINUED] promethazine (PHENERGAN) 25 MG tablet Take 25 mg by mouth every 6 (six) hours as needed for nausea.       No current facility-administered medications on file prior to visit.    Review of Systems:  As per HPI, otherwise negative.    Physical Examination: Filed Vitals:   07/11/13 0853  BP: 112/86  Pulse: 88  Temp: 98.4 F (36.9 C)  Resp: 18   Filed Vitals:   07/11/13 0853  Height: 5\' 5"  (1.651 m)  Weight: 143 lb (64.864 kg)   Body mass index is 23.8 kg/(m^2). Ideal Body Weight: Weight in (lb) to have BMI = 25: 149.9  GEN: WDWN, NAD, Non-toxic, A & O x 3 HEENT: Atraumatic, Normocephalic. Neck supple. No masses, No LAD. Ears and Nose: No external deformity. CV: RRR, No M/G/R. No JVD. No thrill. No extra heart sounds. PULM: CTA B, no wheezes, crackles, rhonchi. No retractions. No resp. distress. No accessory muscle use. ABD: S, epigastric tenderness , ND, +BS. No rebound. No HSM. EXTR: No c/c/e NEURO Normal gait.  PSYCH: Normally interactive. Conversant. Not depressed or anxious appearing.  Calm demeanor.    Assessment and Plan: Abdominal pain Labs Add  carafate Follow up with GI Red flags   Signed,  Phillips OdorJeffery Marvetta Vohs, MD   Results for orders placed in visit on 07/11/13  IFOBT (OCCULT BLOOD)      Result Value Ref Range   IFOBT Negative    POCT CBC      Result Value Ref Range   WBC 7.0  4.6 - 10.2 K/uL   Lymph, poc 2.1  0.6 - 3.4   POC LYMPH PERCENT 30.3  10 - 50 %L   MID (cbc) 0.4  0 - 0.9   POC MID % 5.6  0 - 12 %M   POC Granulocyte 4.5  2 - 6.9   Granulocyte percent 64.1  37 - 80 %G   RBC 5.16  4.04 - 5.48 M/uL   Hemoglobin 14.8  12.2 - 16.2 g/dL   HCT, POC 11.945.0  14.737.7 - 47.9 %   MCV 87.3  80 - 97 fL   MCH, POC 28.7  27 - 31.2 pg   MCHC 32.9  31.8 - 35.4 g/dL   RDW, POC 82.914.4     Platelet Count, POC 279  142 -  424 K/uL   MPV 9.4  0 - 99.8 fL

## 2013-07-11 NOTE — Patient Instructions (Signed)

## 2013-07-12 ENCOUNTER — Other Ambulatory Visit (INDEPENDENT_AMBULATORY_CARE_PROVIDER_SITE_OTHER): Payer: 59 | Admitting: *Deleted

## 2013-07-12 ENCOUNTER — Other Ambulatory Visit: Payer: Self-pay | Admitting: Emergency Medicine

## 2013-07-12 ENCOUNTER — Telehealth: Payer: Self-pay

## 2013-07-12 DIAGNOSIS — R300 Dysuria: Secondary | ICD-10-CM

## 2013-07-12 DIAGNOSIS — N390 Urinary tract infection, site not specified: Secondary | ICD-10-CM

## 2013-07-12 LAB — POCT URINALYSIS DIPSTICK
BILIRUBIN UA: NEGATIVE
GLUCOSE UA: NEGATIVE
KETONES UA: NEGATIVE
NITRITE UA: NEGATIVE
PH UA: 6.5
Protein, UA: 100
Spec Grav, UA: >=1.03
Urobilinogen, UA: 0.2

## 2013-07-12 LAB — POCT UA - MICROSCOPIC ONLY
Casts, Ur, LPF, POC: NEGATIVE
Crystals, Ur, HPF, POC: NEGATIVE
Yeast, UA: NEGATIVE

## 2013-07-12 MED ORDER — SULFAMETHOXAZOLE-TMP DS 800-160 MG PO TABS: 1.0000 | ORAL_TABLET | Freq: Two times a day (BID) | ORAL | Status: DC

## 2013-07-12 MED ORDER — PHENAZOPYRIDINE HCL 200 MG PO TABS: 200.0000 mg | ORAL_TABLET | Freq: Three times a day (TID) | ORAL | Status: DC | PRN

## 2013-07-12 NOTE — Telephone Encounter (Signed)
If she is having urinary symptoms, she should come in and drop a urine off.  She can do it as a lab only.

## 2013-07-12 NOTE — Telephone Encounter (Signed)
PATIENT WAS IN YESTERDAY AND HAS A QUESTION ABOUT SOME SYMPTOMS THAT ARE GOING ON TODAY [phone]

## 2013-07-12 NOTE — Telephone Encounter (Signed)
Pt says that she is now having urinary sxs. Wants to know if it might be related to what she was here for yesterday or if she needs to give a urine sample. Says she can come give us a urine sample but that she wouldn't be able to stay. What do you think/recommend. Please advise. Thanks

## 2013-07-12 NOTE — Telephone Encounter (Signed)
LMOM to CB. 

## 2013-07-12 NOTE — Telephone Encounter (Signed)
Pt notified. Future order placed for u/a

## 2013-07-13 LAB — H. PYLORI ANTIBODY, IGG: H PYLORI IGG: 0.7 {ISR}

## 2013-08-07 ENCOUNTER — Telehealth: Payer: Self-pay

## 2013-08-07 NOTE — Telephone Encounter (Signed)
Dr Neva SeatGreene   Patient needs pain meds until her appointment next week with ortho.  MD.  [phone]

## 2013-08-08 MED ORDER — ACETAMINOPHEN-CODEINE #3 300-30 MG PO TABS: 1.0000 | ORAL_TABLET | ORAL | Status: DC | PRN

## 2013-08-08 NOTE — Telephone Encounter (Signed)
I have sent her tylenol #3 to the pharmacy

## 2013-08-08 NOTE — Telephone Encounter (Signed)
[facility] [address] Left message to return call

## 2013-08-08 NOTE — Telephone Encounter (Signed)
Patient returned call. She did have nausea with Vicodin she was prescribed. She was advised to try tylenol #3 and see how it does. Let us know how she tolerates this.

## 2013-08-09 ENCOUNTER — Telehealth: Payer: Self-pay | Admitting: Emergency Medicine

## 2013-08-09 NOTE — Telephone Encounter (Signed)
Left message to return call 

## 2013-08-09 NOTE — Telephone Encounter (Signed)
Spoke with patient and the tylenol #3 makes her nauseated as well. She doesn't think it works as well as Tramadol either. She would like a RX fro Tramadol since she can not tolerate hydrocodone or tylenol #3. Please advise.  She also stated she will be getting her tens unit Friday so hopefully that will help.

## 2013-08-09 NOTE — Telephone Encounter (Signed)
Patient returned phone call. °

## 2013-08-09 NOTE — Telephone Encounter (Signed)
Patient called to report that the Tylenol 3 is making her nauseous and "woozy" and is not helping her pain.  The patient requests return call to discuss options at [phone].  The patient stated she has Tramadol and it works better than the Tylenol 3, but she would like to speak with someone regarding the situation.

## 2013-08-09 NOTE — Telephone Encounter (Signed)
Patient called again

## 2013-08-10 ENCOUNTER — Other Ambulatory Visit: Payer: Self-pay | Admitting: Emergency Medicine

## 2013-08-10 MED ORDER — TRAMADOL HCL 50 MG PO TABS: 50.0000 mg | ORAL_TABLET | Freq: Three times a day (TID) | ORAL | Status: DC | PRN

## 2013-08-10 NOTE — Telephone Encounter (Signed)
Prescription for tramadol printed

## 2013-08-10 NOTE — Telephone Encounter (Signed)
Rx called in and pt notified.

## 2013-08-26 ENCOUNTER — Telehealth: Payer: Self-pay

## 2013-08-26 NOTE — Telephone Encounter (Signed)
PT STATES SHE WAS GIVEN TYLENOL 3 BEFORE AND IT UPSET HER STOMACH SO WE CHANGED TO TRAMADOL, SHE NEVER GOT IT FILLED BUT WANT TO GO BACK TO THE TYLENOL BECAUSE SHE IS TAKING NAUSEA MEDICINE NOW. PLEASE CALL [phone]   [facility] [address]

## 2013-08-27 NOTE — Telephone Encounter (Signed)
Esplain to her the shots, if they were recommended, should eliminate the need for pain medications.  I have received around 18 and they are very useful. If the tramadol does not help, she should talk to the orthopod treating her.  Otherwise she will need to come in

## 2013-08-27 NOTE — Telephone Encounter (Signed)
Patient returned call.  Advised her per Dr. Dareen PianoAnderson that the shots the orthopedist is wanting to give her are actually very helpful.  He gets them himself and they are very useful.  Told her that if tramadol is not working for her that she needs to follow up with her ortho or she can RTC.  Patient verbalized understanding.

## 2013-08-27 NOTE — Telephone Encounter (Signed)
Patient returned call. She states the orthopedist was giving her tramadol for her back and she is trying to avoid injections in her back.  She said Dr. Dareen PianoAnderson gave her tylenol 3 for her pain.  She said she did not fill the tramadol he gave her.  She is wondering if he would give her more tylenol 3 because it has helped her pain.  Please advise.

## 2013-08-27 NOTE — Telephone Encounter (Signed)
LMVM for patient to CB.   

## 2013-08-27 NOTE — Telephone Encounter (Signed)
LMVM to CB. 

## 2013-09-06 ENCOUNTER — Telehealth: Payer: Self-pay | Admitting: Family Medicine

## 2013-09-06 ENCOUNTER — Ambulatory Visit (INDEPENDENT_AMBULATORY_CARE_PROVIDER_SITE_OTHER): Payer: 59 | Admitting: Family Medicine

## 2013-09-06 VITALS — BP 110/80 | HR 107 | Temp 98.6°F | Resp 12 | Ht 65.0 in | Wt 140.0 lb

## 2013-09-06 DIAGNOSIS — M545 Low back pain, unspecified: Secondary | ICD-10-CM

## 2013-09-06 DIAGNOSIS — G8929 Other chronic pain: Secondary | ICD-10-CM

## 2013-09-06 DIAGNOSIS — J029 Acute pharyngitis, unspecified: Secondary | ICD-10-CM

## 2013-09-06 LAB — POCT RAPID STREP A (OFFICE): Rapid Strep A Screen: NEGATIVE

## 2013-09-06 MED ORDER — MAGIC MOUTHWASH W/LIDOCAINE: 10.0000 mL | ORAL | Status: DC | PRN

## 2013-09-06 MED ORDER — PROMETHAZINE HCL 25 MG PO TABS: 25.0000 mg | ORAL_TABLET | Freq: Three times a day (TID) | ORAL | Status: DC | PRN

## 2013-09-06 MED ORDER — ACETAMINOPHEN-CODEINE #3 300-30 MG PO TABS: 1.0000 | ORAL_TABLET | ORAL | Status: DC | PRN

## 2013-09-06 NOTE — Telephone Encounter (Signed)
Cindy B.  Informed pt of note

## 2013-09-06 NOTE — Telephone Encounter (Signed)
Patient was seen today and states that the mouthwash she was prescribed is too expensive and she is not going to purchase it from the pharmacy. Can she have something else instead possibly a cough syrup or something similar? [facility] [address]  [phone]

## 2013-09-06 NOTE — Patient Instructions (Signed)
Viral Pharyngitis Viral pharyngitis is a viral infection that produces redness, pain, and swelling (inflammation) of the throat. It can spread from person to person (contagious). CAUSES Viral pharyngitis is caused by inhaling a large amount of certain germs called viruses. Many different viruses cause viral pharyngitis. SYMPTOMS Symptoms of viral pharyngitis include:  Sore throat.  Tiredness.  Stuffy nose.  Low-grade fever.  Congestion.  Cough. TREATMENT Treatment includes rest, drinking plenty of fluids, and the use of over-the-counter medication (approved by your caregiver). HOME CARE INSTRUCTIONS   Drink enough fluids to keep your urine clear or pale yellow.  Eat soft, cold foods such as ice cream, frozen ice pops, or gelatin dessert.  Gargle with warm salt water (1 tsp salt per 1 qt of water).  If over age 7, throat lozenges may be used safely.  Only take over-the-counter or prescription medicines for pain, discomfort, or fever as directed by your caregiver. Do not take aspirin. To help prevent spreading viral pharyngitis to others, avoid:  Mouth-to-mouth contact with others.  Sharing utensils for eating and drinking.  Coughing around others. SEEK MEDICAL CARE IF:   You are better in a few days, then become worse.  You have a fever or pain not helped by pain medicines.  There are any other changes that concern you. Document Released: 01/24/2005 Document Revised: 07/09/2011 Document Reviewed: 06/22/2010 Exit[employer] Patient Information 2014 [employer].  

## 2013-09-06 NOTE — Telephone Encounter (Signed)
Per Dr. Clelia CroftShaw pt can take the Tylenol #3 for the throat pain.   There is nothing else to replace the Magic Mouthwash.  Lmom to cb

## 2013-09-06 NOTE — Progress Notes (Addendum)
Subjective:  This chart was scribed for Levell JulyEva N. Clelia CroftShaw, MD by Marica OtterNusrat Rahman, ED Scribe. This patient was seen in room 12 and the patient's care was started at 11:07 AM.     Patient ID: Cindy Rice, female    DOB: May 04, 1985, 28 y.o.   MRN: [id number]  PCP: Carmelina DaneAnderson, Jeffery S, MD  Chief Complaint  Patient presents with  . Sore Throat    x4 days    HPI HPI Comments:  Cindy Rice is a 28 y.o. female who presents to the [facility] complaining of sore throat with associated loss of voice; rhinorrhea; swollen glands; cough; and congestion onset 4 days ago. Pt reports she also experienced nausea, vomiting and fever which have now resolved. Pt denies loss of appetite. Pt reports that she has been taking Claritin and using saline sprays, without much relief. 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  Her main concern is that she would like a medication for the cough and throat pain and something to help her sleep at night.  Pt also reports a Hx of abnormal bowel movements following a cholecystectomy with complications. Pt believes their is a residual bile leak from her cholecystectomy and has been working with Dr. Thornton PapasJeffrey Anderson regarding the same.    Pt also requests a refill on her tylenol #3 - reports she is waiting to get an epidural to help her control her chronic back pain.  Sick Contact: Co-worker with identical symptoms.   Past Medical History  Diagnosis Date  . Anxiety   . Asthma     sports induced   Current Outpatient Prescriptions on File Prior to Visit  Medication Sig Dispense Refill  . norethindrone (MICRONOR,CAMILA,ERRIN) 0.35 MG tablet Take 1 tablet by mouth daily.      . pantoprazole (PROTONIX) 20 MG tablet Take 1 tablet (20 mg total) by mouth daily.  30 tablet  0   No current facility-administered medications on file prior to visit.   Allergies  Allergen  Reactions  . Penicillins Other (See Comments)    Migraines   . Ciprofloxacin Nausea And Vomiting    Review of Systems  Constitutional: Positive for fever, activity change and fatigue. Negative for chills and diaphoresis.  HENT: Positive for congestion, rhinorrhea, sore throat, tinnitus, trouble swallowing and voice change. Negative for ear discharge, ear pain, mouth sores, nosebleeds, postnasal drip, sinus pressure and sneezing.   Eyes: Negative for pain and itching.  Respiratory: Positive for cough. Negative for shortness of breath.   Cardiovascular: Negative for chest pain.  Gastrointestinal: Positive for nausea, vomiting, abdominal pain and diarrhea.  Genitourinary: Negative for dysuria.  Musculoskeletal: Positive for arthralgias, back pain, myalgias and neck pain. Negative for gait problem, joint swelling and neck stiffness.  Skin: Negative for rash.  Neurological: Positive for headaches. Negative for dizziness and syncope.  Hematological: Positive for adenopathy.  Psychiatric/Behavioral: Positive for sleep disturbance.      BP 110/80  Pulse 107  Temp(Src) 98.6 F (37 C) (Oral)  Resp 12  Ht 5\' 5"  (1.651 m)  Wt 140 lb (63.504 kg)  BMI 23.30 kg/m2  SpO2 97%  Objective:   Physical Exam  Constitutional: She is oriented to person, place, and time. She appears well-developed and well-nourished. No distress.  HENT:  Head: Normocephalic and atraumatic.  Right Ear: Tympanic membrane, external ear and ear canal normal.  Left Ear: Tympanic  membrane, external ear and ear canal normal.  Nose: Nose normal. No mucosal edema or rhinorrhea.  Mouth/Throat: Uvula is midline and mucous membranes are normal. Mucous membranes are not pale and not dry. No trismus in the jaw. No uvula swelling. Oropharyngeal exudate and posterior oropharyngeal erythema present. No posterior oropharyngeal edema or tonsillar abscesses.  1+ tonsils with small amounts of tonsilur exudate and erythema.   Eyes:  Conjunctivae are normal. Right eye exhibits no discharge. Left eye exhibits no discharge. No scleral icterus.  Neck: Trachea normal and normal range of motion. Neck supple. No mass and no thyromegaly present.  Cardiovascular: Regular rhythm, normal heart sounds and intact distal pulses.  Tachycardia present.   Pulmonary/Chest: Effort normal and breath sounds normal. No respiratory distress.  Lymphadenopathy:       Head (right side): No submandibular, no tonsillar, no preauricular, no posterior auricular and no occipital adenopathy present.       Head (left side): No submandibular, no tonsillar, no preauricular, no posterior auricular and no occipital adenopathy present.    She has no cervical adenopathy.       Right cervical: No superficial cervical and no posterior cervical adenopathy present.      Left cervical: No superficial cervical and no posterior cervical adenopathy present.       Right: No supraclavicular adenopathy present.       Left: No supraclavicular adenopathy present.  Neurological: She is alert and oriented to person, place, and time.  Skin: Skin is warm and dry. She is not diaphoretic. No erythema.  Psychiatric: She has a normal mood and affect. Her behavior is normal.     Results for orders placed in visit on 09/06/13  CULTURE, GROUP A STREP      Result Value Ref Range   Organism ID, Bacteria Normal Upper Respiratory Flora     Organism ID, Bacteria No Beta Hemolytic Streptococci Isolated    POCT RAPID STREP A (OFFICE)      Result Value Ref Range   Rapid Strep A Screen Negative  Negative   Assessment & Plan:  11:19 AM-Discussed treatment plan with pt at bedside and pt agreed to plan.   Acute pharyngitis - Plan: POCT rapid strep A, Culture, Group A Strep - suspect viral cause - try below meds for symptomatic care, RTC if worsening  Chronic low back pain - refilled pt's tylenol #3 - pt states she is sched to get epidural for pain relief soon.  Meds ordered this  encounter  Medications  . acetaminophen-codeine (TYLENOL #3) 300-30 MG per tablet    Sig: Take 1-2 tablets by mouth every 4 (four) hours as needed.    Dispense:  30 tablet    Refill:  0  . Alum & Mag Hydroxide-Simeth (MAGIC MOUTHWASH W/LIDOCAINE) SOLN    Sig: Take 10 mLs by mouth every 2 (two) hours as needed for mouth pain.    Dispense:  360 mL    Refill:  0    Ok to use your pharmacy's formulary. Mix in a 2:1 solution with viscous lidocaine  . promethazine (PHENERGAN) 25 MG tablet    Sig: Take 1 tablet (25 mg total) by mouth every 8 (eight) hours as needed for nausea or vomiting.    Dispense:  20 tablet    Refill:  0    I personally performed the services described in this documentation, which was scribed in my presence. The recorded information has been reviewed and considered, and addended by me as needed.  Delman Cheadle, MD MPH

## 2013-09-07 ENCOUNTER — Telehealth: Payer: Self-pay

## 2013-09-07 NOTE — Telephone Encounter (Signed)
PT IS Cindy Rice

## 2013-09-07 NOTE — Telephone Encounter (Signed)
PT CALLED EARLIER, STATES WAS SEEN YESTERDAY , BUT IS IN NEED OF SOMETHING TONIGHT THAT WILL HELP HER SLEEP  STATES SHE IS ON DAY 5 AND MEDS GIVEN YESTERDAY NOT HELPING

## 2013-09-08 LAB — CULTURE, GROUP A STREP: Organism ID, Bacteria: NORMAL

## 2013-09-08 MED ORDER — HYDROXYZINE HCL 25 MG PO TABS: 12.5000 mg | ORAL_TABLET | Freq: Three times a day (TID) | ORAL | Status: DC | PRN

## 2013-09-08 MED ORDER — GUAIFENESIN-CODEINE 100-10 MG/5ML PO SYRP: 5.0000 mL | ORAL_SOLUTION | Freq: Three times a day (TID) | ORAL | Status: DC | PRN

## 2013-09-08 NOTE — Telephone Encounter (Signed)
Called patient. Her phone was breaking up badly because of service area.  She said that coughing and constant itching are keeping her up at night.  She called back from a land line.  She states that she is not having any improvement and is her cough is progressively worsening. I advised her she may want to RTC to be reevaluated, however, patient states she had to work today and is not able to RTC.  Could we please send in some more medication for her?  Please advise.

## 2013-09-08 NOTE — Telephone Encounter (Signed)
I'm sorry she is not starting to feel better. What symptoms are keeping her up at night? Usually the tylenol with codeine or the promethazine knock most people out but we can try something different if necessary.  If she is starting to feel worse she may need to be rechecked to see if she needs an antibiotic and/or steroids.

## 2013-09-08 NOTE — Telephone Encounter (Signed)
Vistaril sent in. Please call or fax the guaif-tussion.

## 2013-09-09 IMAGING — US US OB TRANSVAGINAL
1 series · 13 of 25 positions shown · non-contrast
Comparison: None.

CLINICAL DATA: 26-year-old female who is 5 weeks and 4 days
gestational age by LMP with quantitative beta HCG of 0030.
Spotting and pain.

OBSTETRIC <14 WK US AND TRANSVAGINAL OB US
TECHNIQUE: Both transabdominal and transvaginal ultrasound
examinations were performed for complete evaluation of the
gestation as well as the maternal uterus, adnexal regions, and
pelvic cul-de-sac.  Transvaginal technique was performed to assess
early pregnancy.

[Series 1: us ob comp less 14 wks · 13 of 25 slices shown]
[im 1/25]
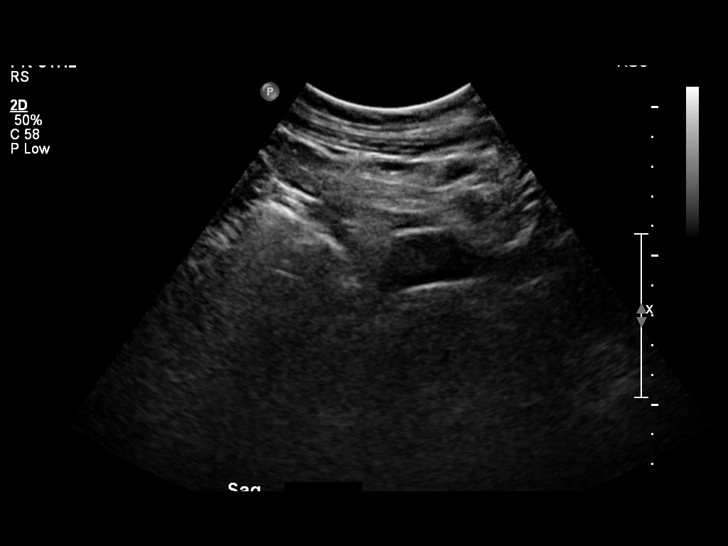
[im 3/25]
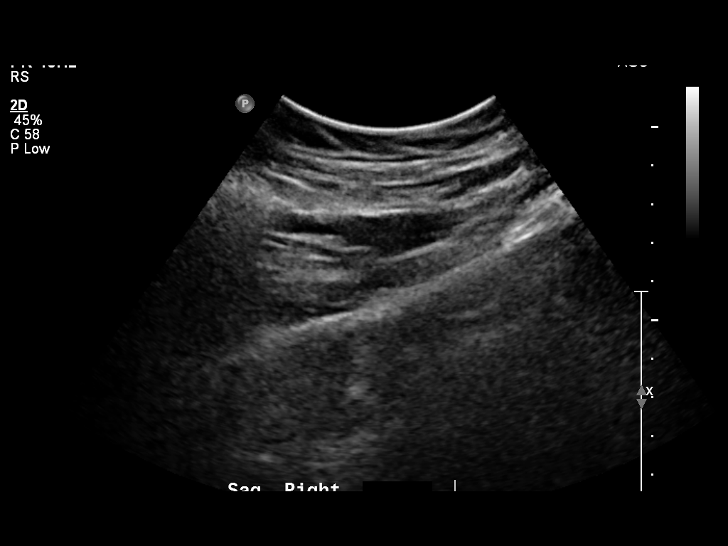
[im 5/25]
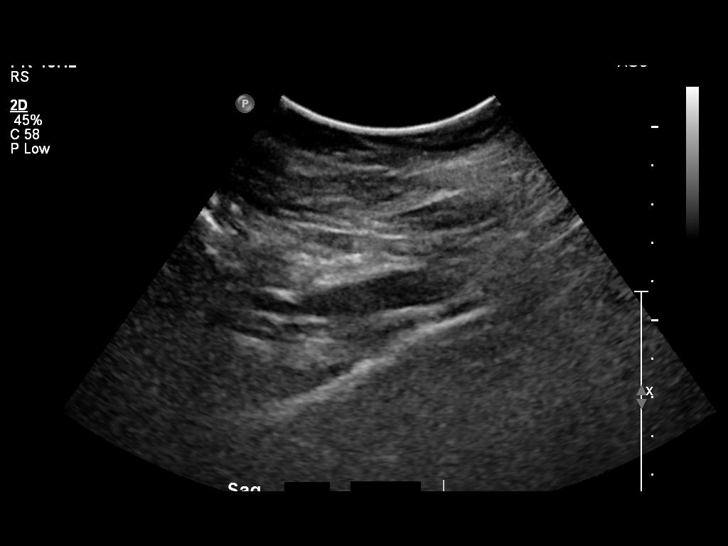
[im 7/25]
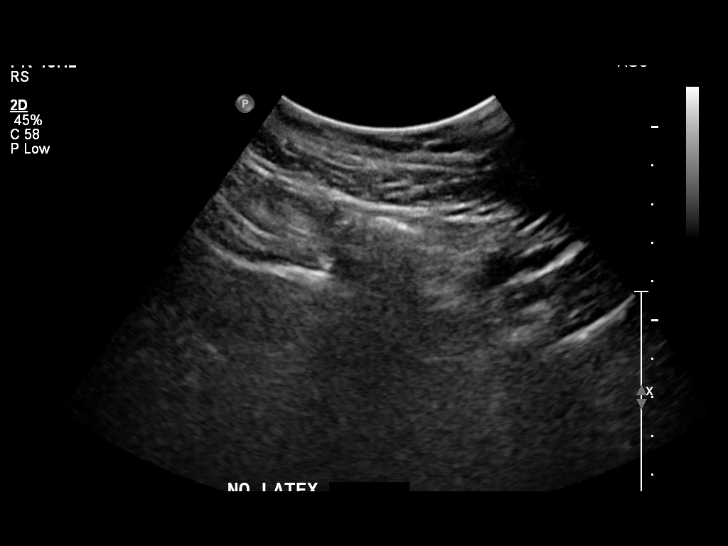
[im 9/25]
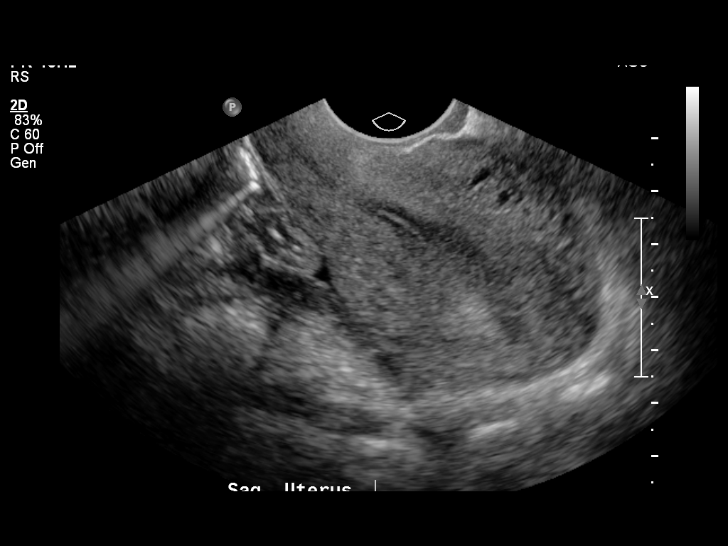
[im 11/25]
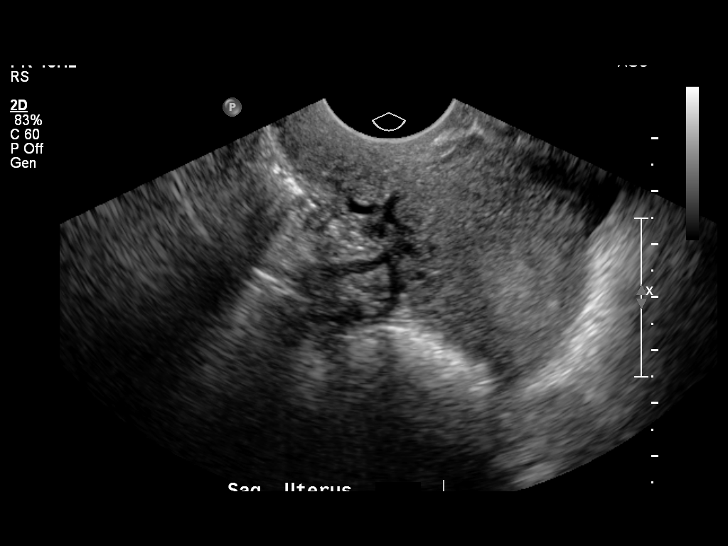
[im 13/25]
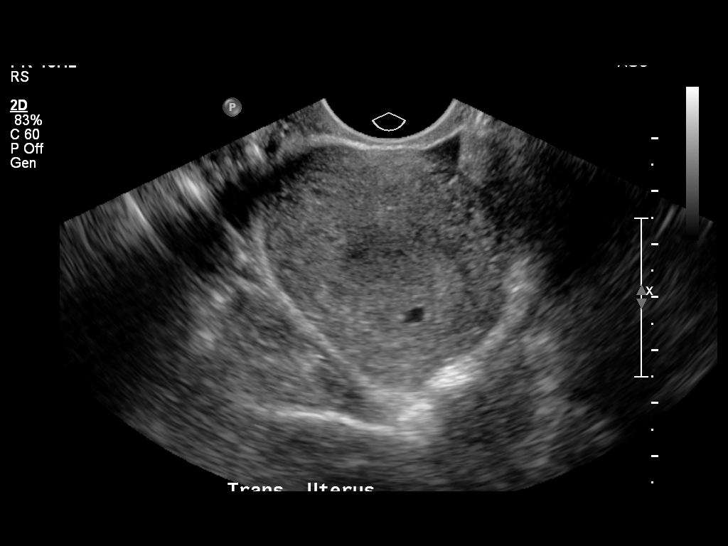
[im 15/25]
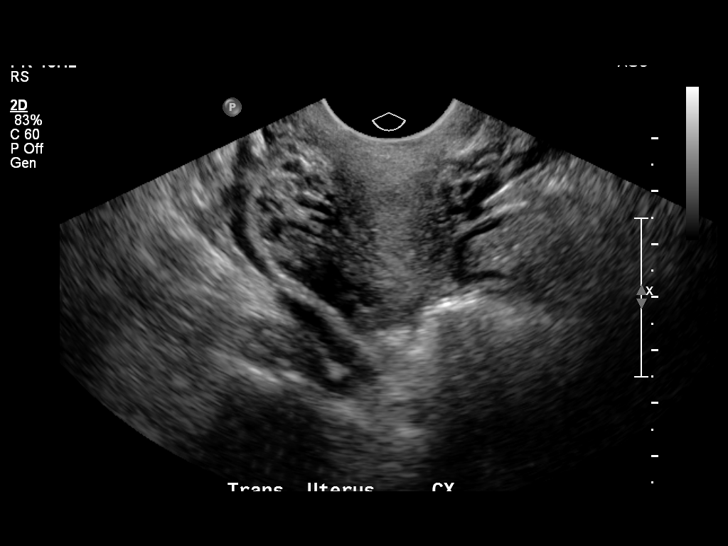
[im 17/25]
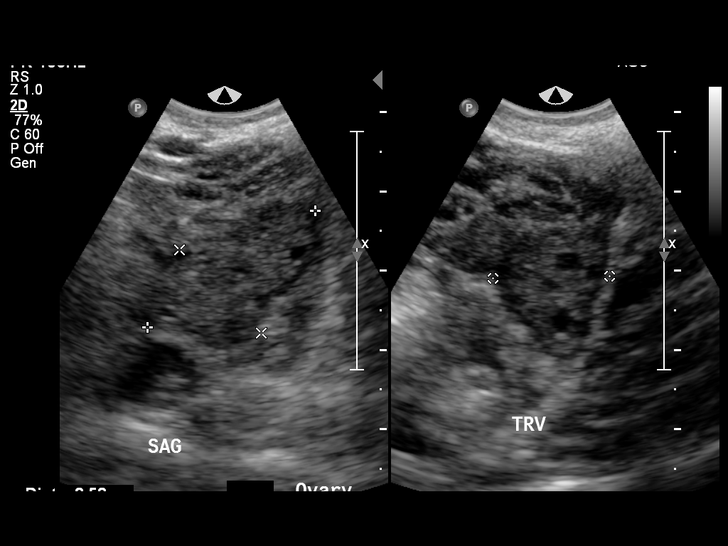
[im 19/25]
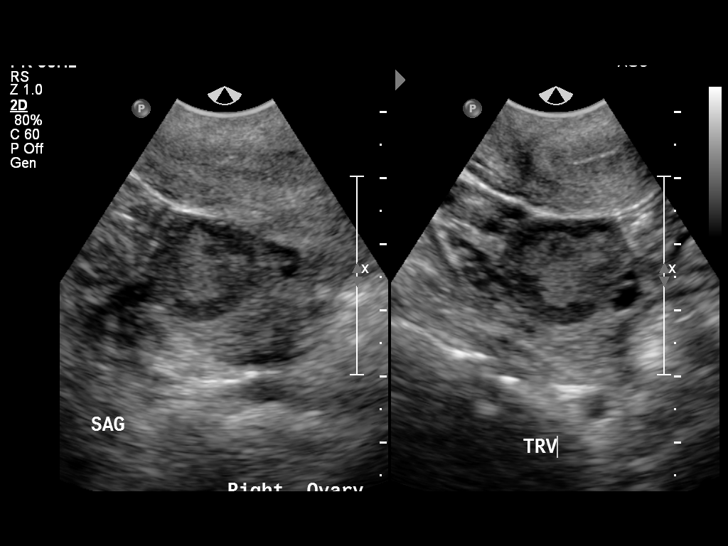
[im 21/25]
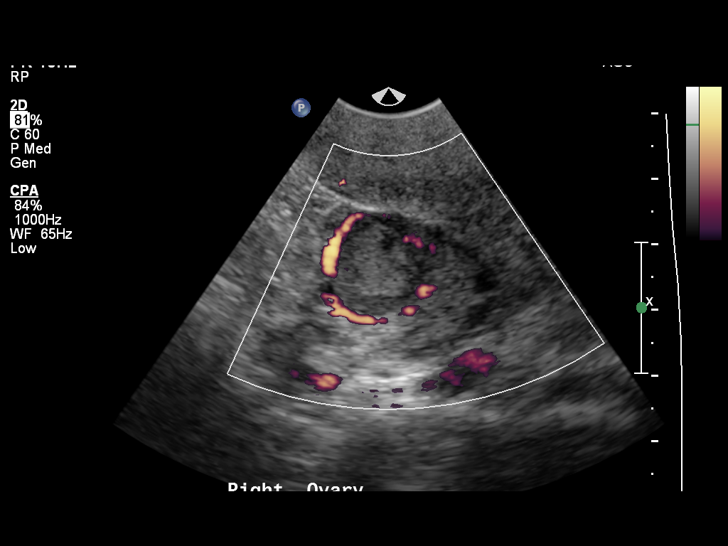
[im 23/25]
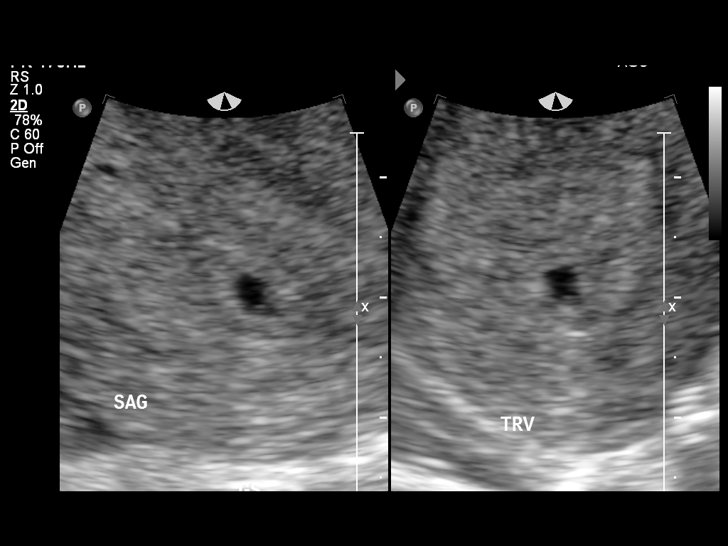
[im 25/25]
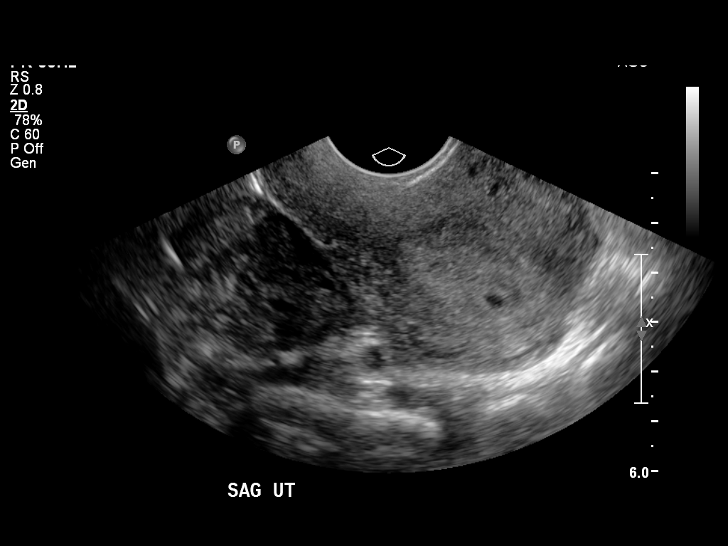

[13 of 25 positions shown; findings below may reference images not displayed]

Intrauterine gestational sac:  Possible single
Yolk sac: Not visible
Embryo: Not visible
Cardiac Activity: Not detected

MSD: 3.1 mm  4 w 5 d
            US EDC: 09/26/2012

Maternal uterus/adnexae:
No subchorionic hemorrhage or pelvic free fluid.  Both ovaries are
somewhat heterogeneous but normal in size.  The left measures 2.6 x
1.5 x 1.5 cm.  The right measures 2.8 x 2.0 x 2.0 cm and probably
contains the corpus luteum (see image 23).
IMPRESSION: 1.  Possible early intrauterine pregnancy, but no yolk sac or fetal
pole identified.  Mean sac diameter would suggest a gestational age
of 4 weeks and 5 days. Recommend correlation with serial
quantitative BHCG and followup imaging.
2.  No subchorionic hemorrhage or free fluid.  No suspicious
adnexal finding.

## 2013-09-11 ENCOUNTER — Telehealth: Payer: Self-pay

## 2013-09-11 NOTE — Telephone Encounter (Signed)
Patient called and wanted to let Dr. Clelia CroftShaw know that the cough medicine and anti-itch medications worked and that she is improving. Patient also wants to know if her tylenol 3 prescription can be refilled until her epidural is done on Tuesday. She wants to know if the tylenol 3 can be refilled to hold her over until the epidural. Please return call and advise. Thank you.

## 2013-09-11 NOTE — Telephone Encounter (Signed)
Patient advised message has been sent to Dr Clelia CroftShaw for review, this should come from Dr Clelia CroftShaw as per our controlled substance policy

## 2013-09-12 MED ORDER — ACETAMINOPHEN-CODEINE #3 300-30 MG PO TABS: 1.0000 | ORAL_TABLET | ORAL | Status: DC | PRN

## 2013-09-12 NOTE — Telephone Encounter (Signed)
Pt notified that we are waiting for Dr. Ewell PoeAnderson's reply. She does have an appt on Tues for epidural.

## 2013-09-12 NOTE — Telephone Encounter (Signed)
Pt came in to the office requesting Tylenol # 3. Sent to Dr. Clelia CroftShaw to review again.

## 2013-09-12 NOTE — Telephone Encounter (Signed)
I just refilled it one time for pt - pt states that she was seeing Dr. Dareen PianoAnderson for the tylenol #3. I will let Dr. Dareen PianoAnderson refill if he wants but if its up to me, she needs to come in for an OV w/ her PCP for any controlled medicines.

## 2013-09-16 IMAGING — US US OB TRANSVAGINAL
1 series · 14 of 27 positions shown · non-contrast
Comparison: none

[Series 1: us ob transvaginal · 14 of 27 slices shown]
[im 1/27]
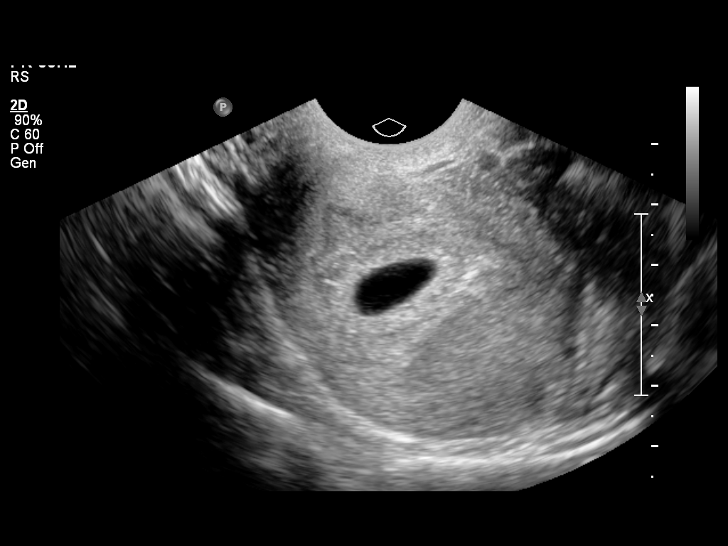
[im 3/27]
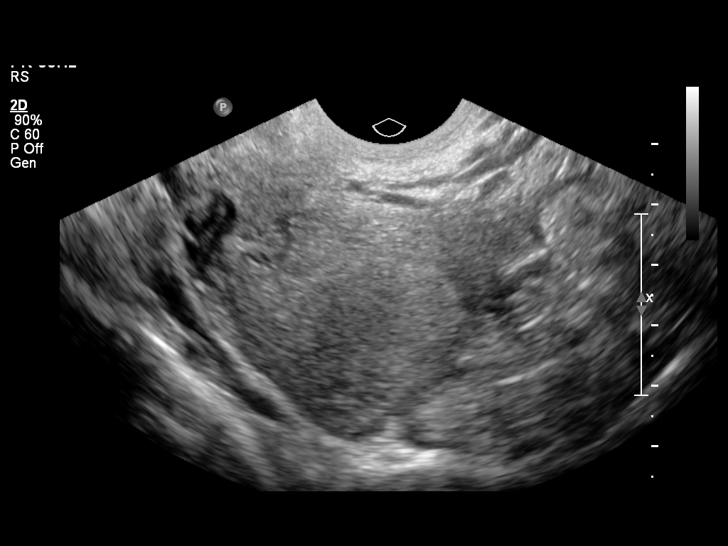
[im 5/27]
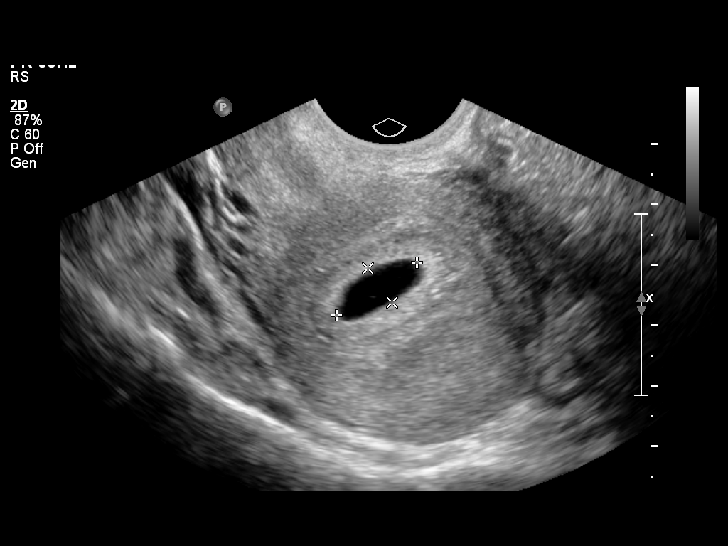
[im 7/27]
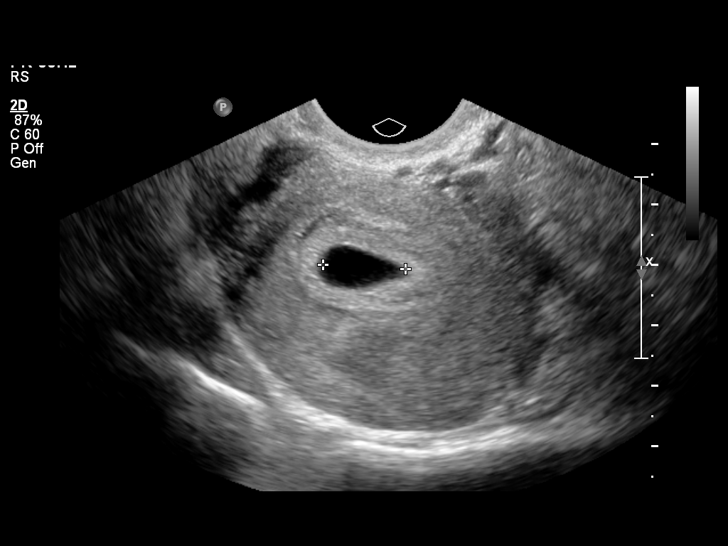
[im 9/27]
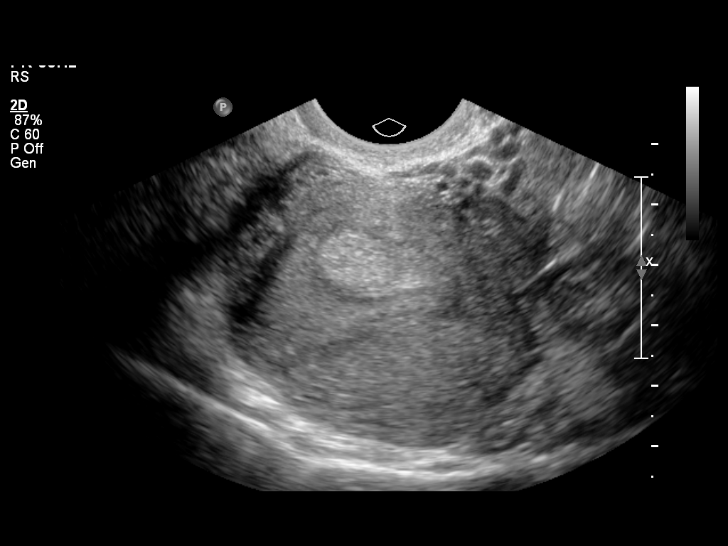
[im 11/27]
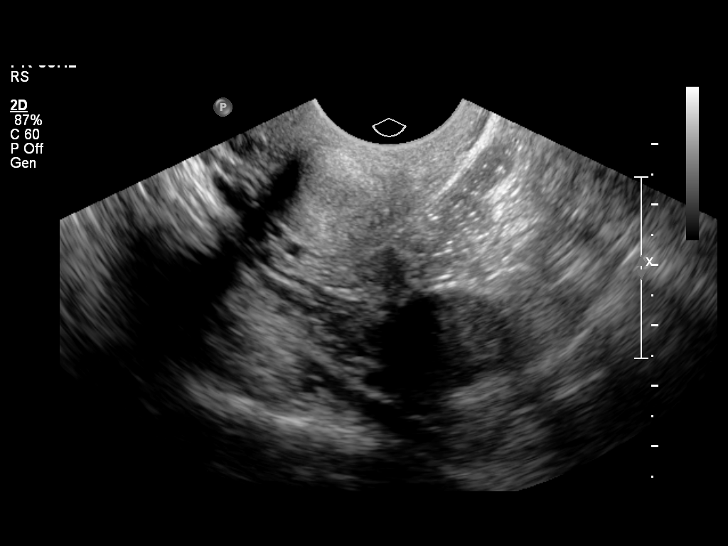
[im 13/27]
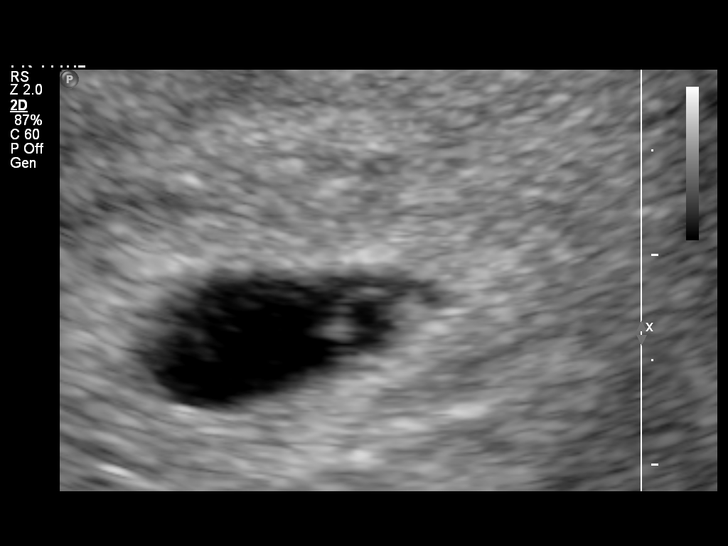
[im 15/27]
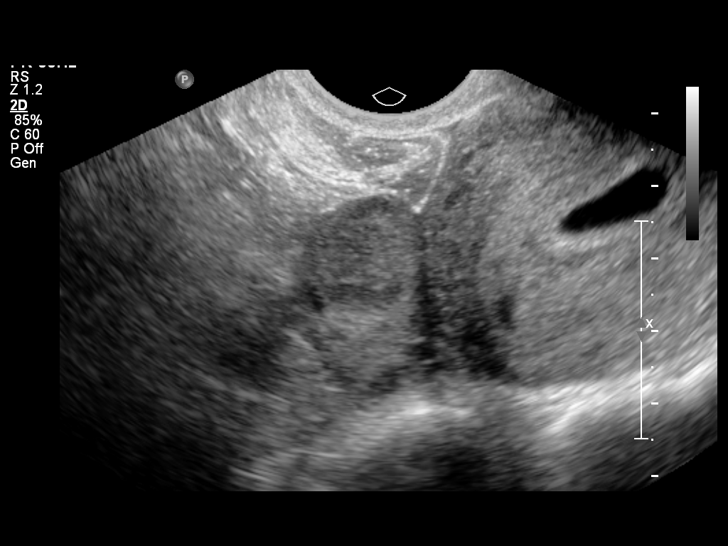
[im 17/27]
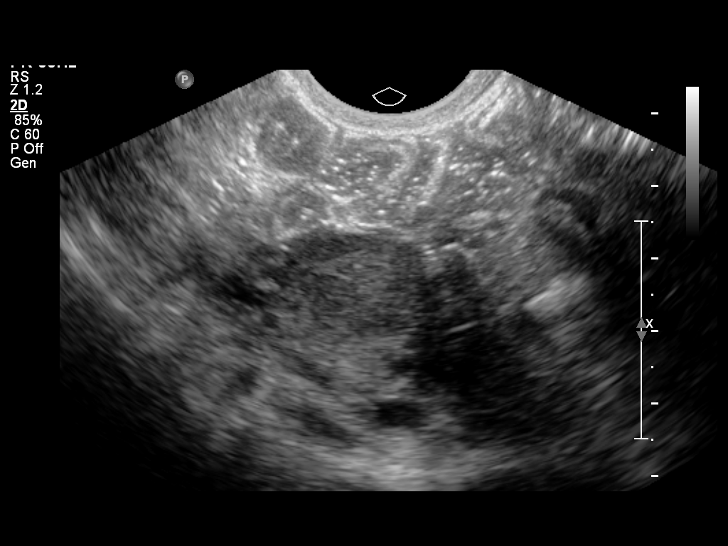
[im 19/27]
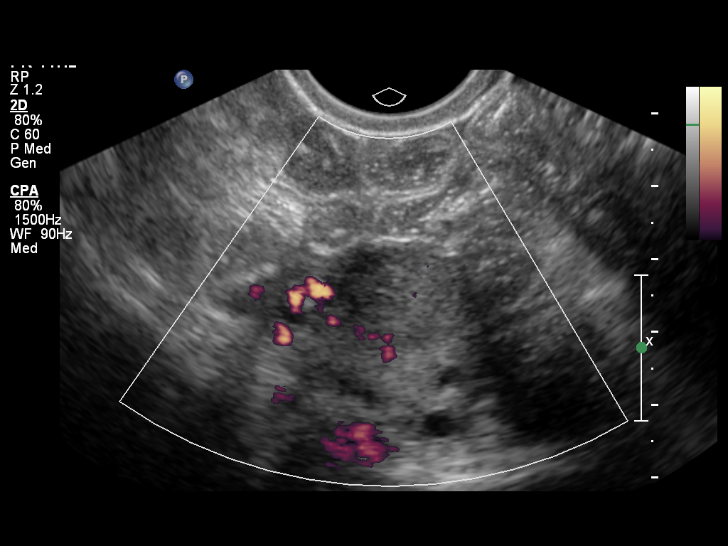
[im 21/27]
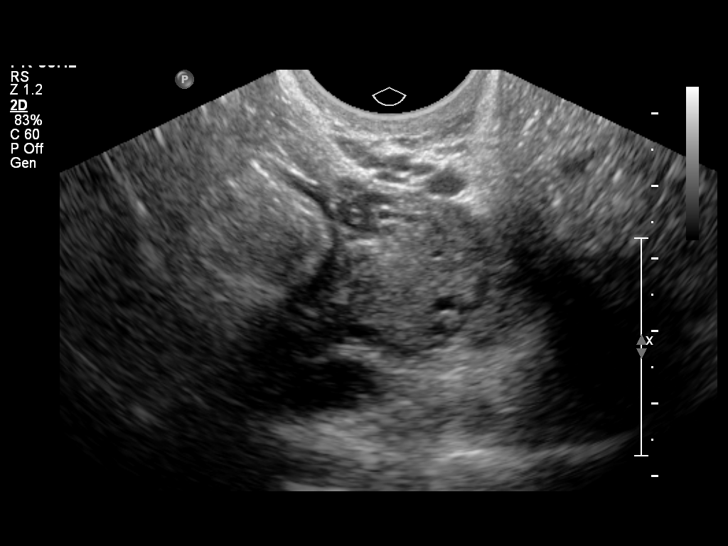
[im 23/27]
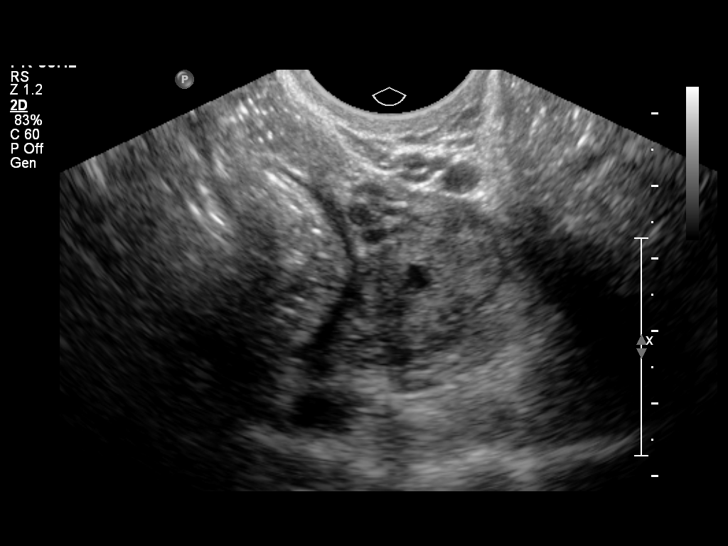
[im 25/27]
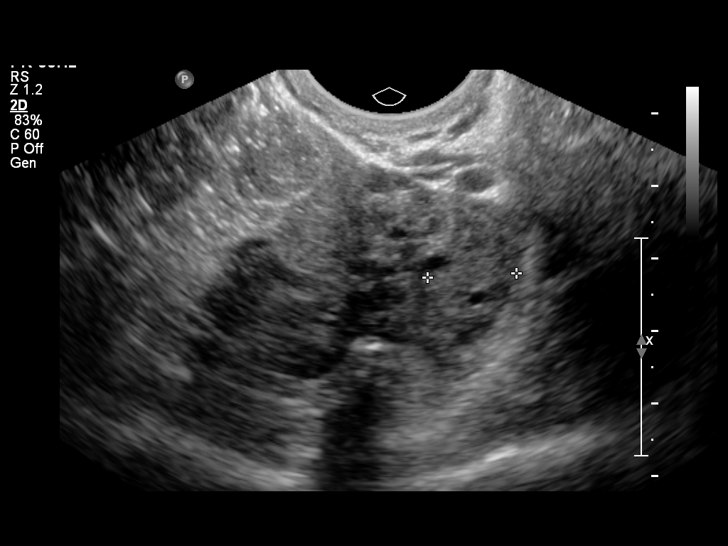
[im 27/27]
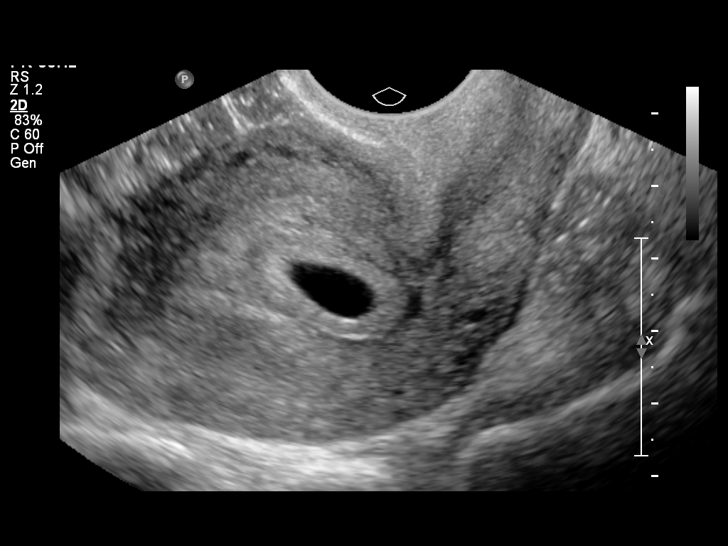

[14 of 27 positions shown; findings below may reference images not displayed]

OBSTETRICS REPORT
                    (Corrected Final 02/04/2012 [DATE])

Service(s) Provided

 US OB TRANSVAGINAL                                    76817.0
Indications

 Pregnancy with inconclusive fetal viability
Fetal Evaluation

 Num Of Fetuses:    1
 Preg. Location:    Intrauterine
 Gest. Sac:         Intrauterine
 Yolk Sac:          Visualized
 Fetal Pole:        Not visualized
 Cardiac Activity:  No embryo visualized
Biometry

 GS:      12.2  mm     G. Age:  6w 0d                  EDD:    09/24/12
Cervix Uterus Adnexa

 Cervix:       Closed
 Uterus:       No abnormality visualized.
 Cul De Sac:   No free fluid seen.
 Left Ovary:    Within normal limits.
 Right Ovary:   Within normal limits. Small corpus luteum noted.

 Adnexa:     No abnormality visualized.
Impression

 Intrauterine pregnancy. An intrauterine gestational sac and
 yolk sac are now visualized. An embryo is not visualized at
 this time, but not unexpected given the GS size.
Recommendations

 Follow-up ultrasounds as clinically indicated, and at 18-19
 weeks GA for fetal anatomic evaluation.

                 Attending Physician, DELOWR

## 2013-09-18 ENCOUNTER — Ambulatory Visit (INDEPENDENT_AMBULATORY_CARE_PROVIDER_SITE_OTHER): Payer: 59 | Admitting: Family Medicine

## 2013-09-18 VITALS — BP 112/82 | HR 112 | Temp 99.0°F | Resp 20 | Ht 64.5 in | Wt 141.0 lb

## 2013-09-18 DIAGNOSIS — N61 Mastitis without abscess: Secondary | ICD-10-CM

## 2013-09-18 MED ORDER — CEPHALEXIN 500 MG PO CAPS: 500.0000 mg | ORAL_CAPSULE | Freq: Four times a day (QID) | ORAL | Status: DC

## 2013-09-18 NOTE — Patient Instructions (Signed)
Mastitis   Mastitis is inflammation of the breast tissue. It occurs most often in women who are breastfeeding, but it can also affect other women, and even sometimes men.  CAUSES   Mastitis is usually caused by a bacterial infection. Bacteria enter the breast tissue through cuts or openings in the skin. Typically, this occurs with breastfeeding because of cracked or irritated skin. Sometimes, it can occur even when there is no opening in the skin. It can be associated with plugged milk (lactiferous) ducts. Nipple piercing can also lead to mastitis. Also, some forms of breast cancer can cause mastitis.  SIGNS AND SYMPTOMS   · Swelling, redness, tenderness, and pain in an area of the breast.  · Swelling of the glands under the arm on the same side.  · Fever.  If an infection is allowed to progress, a collection of pus (abscess) may develop.  DIAGNOSIS   Your health care provider can usually diagnose mastitis based on your symptoms and a physical exam. Tests may be done to help confirm the diagnosis. These may include:   · Removal of pus from the breast by applying pressure to the area. This pus can be examined in the lab to determine which bacteria are present. If an abscess has developed, the fluid in the abscess can be removed with a needle. This can also be used to confirm the diagnosis and determine the bacteria present. In most cases, pus will not be present.  · Blood tests to determine if your body is fighting a bacterial infection.  · Mammogram or ultrasound tests to rule out other problems or diseases.  TREATMENT   Antibiotic medicine is used to treat a bacterial infection. Your health care provider will determine which bacteria are most likely causing the infection and will select an appropriate antibiotic. This is sometimes changed based on the results of tests performed to identify the bacteria, or if there is no response to the antibiotic selected. Antibiotics are usually given by mouth. You may also be  given medicine for pain.  Mastitis that occurs with breastfeeding will sometimes go away on its own, so your health care provider may choose to wait 24 hours after first seeing you to decide whether a prescription medicine is needed.  HOME CARE INSTRUCTIONS   · Only take over-the-counter or prescription medicines for pain, fever, or discomfort as directed by your health care provider.  · If your health care provider prescribed an antibiotic, take the medicine as directed. Make sure you finish it even if you start to feel better.  · Do not wear a tight or underwire bra. Wear a soft, supportive bra.  · Increase your fluid intake, especially if you have a fever.  · Women who are breastfeeding should follow these instructions:  · Continue to empty the breast. Your health care provider can tell you whether this milk is safe for your infant or needs to be thrown out. You may be told to stop nursing until your health care provider thinks it is safe for your baby. Use a breast pump if you are advised to stop nursing.  · Keep your nipples clean and dry.  · Empty the first breast completely before going to the other breast. If your baby is not emptying your breasts completely for some reason, use a breast pump to empty your breasts.  · If you go back to work, pump your breasts while at work to stay in time with your nursing schedule.  · Avoid   allowing your breasts to become overly filled with milk (engorged).  SEEK MEDICAL CARE IF:   · You have pus-like discharge from the breast.  · Your symptoms do not improve with the treatment prescribed by your health care provider within 2 days.  SEEK IMMEDIATE MEDICAL CARE IF:   · Your pain and swelling are getting worse.  · You have pain that is not controlled with medicine.  · You have a red line extending from the breast toward your armpit.  · You have a fever or persistent symptoms for more than 2 3 days.  · You have a fever and your symptoms suddenly get worse.  Document Released:  04/16/2005 Document Revised: 02/04/2013 Document Reviewed: 11/14/2012  [employer]® Patient Information ©2014 [employer].

## 2013-09-18 NOTE — Progress Notes (Signed)
28 yo woman with 28 year old who  Is weaning.  Right breast has become tender and more engorged with redness over two days. Child is now sleeping through the night.  Objective:  NAD Right breast engorged with blotchy red outer upper quadrant  Acute mastitis of right breast - Plan: cephALEXin (KEFLEX) 500 MG capsule  Signed, Elvina Sidle, MD

## 2013-09-19 ENCOUNTER — Telehealth: Payer: Self-pay

## 2013-09-19 NOTE — Telephone Encounter (Signed)
PATIENT STATES SHE SAW DR. Milus Glazier LAST NIGHT FOR MASTITIS IN HER (R) BREAST. TODAY SHE IS HAVING A  LOT OF PAIN AND A FEVER. SHE WOULD LIKE TO GET SOMETHING FOR THE PAIN CALLED INTO HER PHARMACY. SHE USUALLY TAKES TYLENOL #3 WHICH SEEMS TO HELP.  BEST PHONE [phone] (CELL)  PHARMACY CHOICE IS [facility] ON [address]   [facility]BC

## 2013-09-20 MED ORDER — ACETAMINOPHEN-CODEINE #3 300-30 MG PO TABS: 1.0000 | ORAL_TABLET | ORAL | Status: DC | PRN

## 2013-09-20 NOTE — Telephone Encounter (Signed)
Left message RX ready for pick up.

## 2013-09-20 NOTE — Telephone Encounter (Signed)
Rx for Tylenol 3 signed, ready for pt pick up

## 2013-09-20 NOTE — Telephone Encounter (Signed)
Pt in office demanding tylenol #3 refill - states usually rx'ed by PCP Dr. Dareen Piano but upon closer review of chart I see that he has actually only given this to her once before. I refilled it in her OV <1 wk prior and she has already used all #30 tabs.  She states she had an appt for an epidural for her back pain in 3d but I do not see any imaging or c/s notes in the chart.  Tylenol #3 refilled w/ #10 pills only but has to have visit w/ PCP to discuss if this is chronic and review of our controlled substance policy before any additional refills.

## 2013-09-25 ENCOUNTER — Ambulatory Visit (INDEPENDENT_AMBULATORY_CARE_PROVIDER_SITE_OTHER): Payer: 59 | Admitting: Family Medicine

## 2013-09-25 VITALS — BP 124/84 | HR 113 | Temp 98.7°F | Resp 16 | Ht 64.5 in | Wt 141.2 lb

## 2013-09-25 DIAGNOSIS — A09 Infectious gastroenteritis and colitis, unspecified: Secondary | ICD-10-CM

## 2013-09-25 DIAGNOSIS — E860 Dehydration: Secondary | ICD-10-CM

## 2013-09-25 DIAGNOSIS — R112 Nausea with vomiting, unspecified: Secondary | ICD-10-CM

## 2013-09-25 MED ORDER — PROMETHAZINE HCL 25 MG/ML IJ SOLN: 25.0000 mg | Freq: Four times a day (QID) | INTRAMUSCULAR | Status: DC | PRN

## 2013-09-25 MED ORDER — PROMETHAZINE HCL 25 MG/ML IJ SOLN
25.0000 mg | Freq: Once | INTRAMUSCULAR | Status: AC
  Administered 2013-09-25: 25 mg via INTRAMUSCULAR

## 2013-09-25 MED ORDER — PROMETHAZINE HCL 25 MG PO TABS: ORAL_TABLET | ORAL | Status: DC

## 2013-09-25 MED ORDER — ONDANSETRON 4 MG PO TBDP: ORAL_TABLET | ORAL | Status: DC

## 2013-09-25 NOTE — Progress Notes (Signed)
Subjective: Patient has been sick since early this morning. She's had repeated vomiting and now just has dry heaves. Her father-in-law had the same thing starting during the nighttime. She also has been having diarrhea about 5 times. She has not been able to drink anything and feels dry. She has continued to urinate. She recently stopped breast-feeding. She has not had a menstrual cycle but says she is not pregnant. Was amenorrheic from nursing.  Objective: Somewhat ill-appearing lady in obvious discomfort. Her throat has moist mucous membranes still. Neck supple without nodes. Abdomen has occasional bowel sounds. Soft without masses. Mild tenderness, nonspecific. No CVA tenderness.  Assessment: Acute gastroenteritis with nausea vomiting and diarrhea  Plan: Gave her a shot of Phenergan. She prefers this to Zofran. Gave her prescription for both Zofran ODT and Phenergan tablets.  Return if worse or go to the bathroom.

## 2013-09-25 NOTE — Patient Instructions (Signed)
Try to take clear liquids such as ginger ale or Gatorade and  water and ice to hydrate you.  If he got abruptly worse please return or go to the emergency room

## 2014-01-21 ENCOUNTER — Ambulatory Visit: Payer: 59 | Admitting: Physical Therapy

## 2014-01-21 ENCOUNTER — Ambulatory Visit: Payer: 59 | Attending: Sports Medicine | Admitting: Physical Therapy

## 2014-01-21 DIAGNOSIS — M25559 Pain in unspecified hip: Secondary | ICD-10-CM | POA: Diagnosis not present

## 2014-01-21 DIAGNOSIS — M545 Low back pain, unspecified: Secondary | ICD-10-CM | POA: Insufficient documentation

## 2014-01-21 DIAGNOSIS — IMO0001 Reserved for inherently not codable concepts without codable children: Secondary | ICD-10-CM | POA: Diagnosis not present

## 2014-01-28 ENCOUNTER — Ambulatory Visit: Payer: 59 | Admitting: Physical Therapy

## 2014-01-29 ENCOUNTER — Ambulatory Visit: Payer: 59 | Admitting: Physical Therapy

## 2014-02-16 ENCOUNTER — Ambulatory Visit: Payer: 59 | Attending: Sports Medicine | Admitting: Physical Therapy

## 2014-02-26 ENCOUNTER — Ambulatory Visit (INDEPENDENT_AMBULATORY_CARE_PROVIDER_SITE_OTHER): Payer: 59 | Admitting: Physician Assistant

## 2014-02-26 VITALS — BP 126/86 | HR 102 | Temp 99.0°F | Resp 18 | Ht 65.0 in | Wt 144.0 lb

## 2014-02-26 DIAGNOSIS — J069 Acute upper respiratory infection, unspecified: Secondary | ICD-10-CM

## 2014-02-26 DIAGNOSIS — B9789 Other viral agents as the cause of diseases classified elsewhere: Secondary | ICD-10-CM

## 2014-02-26 DIAGNOSIS — J029 Acute pharyngitis, unspecified: Secondary | ICD-10-CM

## 2014-02-26 LAB — POCT RAPID STREP A (OFFICE): Rapid Strep A Screen: NEGATIVE

## 2014-02-26 MED ORDER — HYDROCOD POLST-CHLORPHEN POLST 10-8 MG/5ML PO LQCR: 5.0000 mL | Freq: Two times a day (BID) | ORAL | Status: DC | PRN

## 2014-02-26 MED ORDER — GUAIFENESIN ER 1200 MG PO TB12: 1.0000 | ORAL_TABLET | Freq: Two times a day (BID) | ORAL | Status: DC | PRN

## 2014-02-26 MED ORDER — IPRATROPIUM BROMIDE 0.03 % NA SOLN: 2.0000 | Freq: Two times a day (BID) | NASAL | Status: DC

## 2014-02-26 NOTE — Patient Instructions (Signed)
Take tussionex, atrovent and mucinex for symptom control. If still having fevers on Monday, return to clinic. Otherwise, return in 7-10 days if symptoms aren't improved.

## 2014-02-26 NOTE — Progress Notes (Signed)
I was directly involved with the patient's care and agree with the physical, diagnosis and treatment plan.  

## 2014-02-26 NOTE — Progress Notes (Signed)
Subjective:    Patient ID: Cindy Rice, female    DOB: 02-13-1986, 28 y.o.   MRN: [id number] Patient Active Problem List   Diagnosis Date Noted  . Chronic low back pain 09/06/2013   Prior to Admission medications   Medication Sig Start Date End Date Taking? Authorizing Provider  DULoxetine (CYMBALTA) 30 MG capsule Take 30 mg by mouth daily.   Yes Historical Provider, MD  meloxicam (MOBIC) 7.5 MG tablet Take 7.5 mg by mouth 2 (two) times daily.   Yes Historical Provider, MD  norgestimate-ethinyl estradiol (ORTHO-CYCLEN,SPRINTEC,PREVIFEM) 0.25-35 MG-MCG tablet Take 1 tablet by mouth daily.   Yes Historical Provider, MD   Otalgia  Associated symptoms include coughing, rhinorrhea and a sore throat. Pertinent negatives include no headaches.  Sore Throat  Associated symptoms include congestion, coughing and ear pain. Pertinent negatives include no headaches or shortness of breath.  Cough Associated symptoms include ear pain, a fever, postnasal drip, rhinorrhea and a sore throat. Pertinent negatives include no chills, headaches, shortness of breath or wheezing.    This is a 28 year old female presenting with 5 days of sore throat, congestion, ear fullness, postnasal drip and dry cough. The first 4 days of illness she had a fever with a peak of 102 - today she has had less of a fever. She reports her cough is worse at night and is keeping her from sleeping. She tried dayquil, nyquil, robitussin with relief. She does not have any sick contacts. She denies facial pain, shortness of breath or wheezing. 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 She is not a smoker. She reports she got back from a 5 [place] this [place] - she was not able to rest much.    Review of Systems  Constitutional: Positive for fever. Negative for chills.  HENT: Positive for congestion, ear pain, postnasal drip, rhinorrhea and sore throat. Negative for sinus  pressure.   Eyes: Negative.   Respiratory: Positive for cough. Negative for shortness of breath and wheezing.   Gastrointestinal: Negative.   Musculoskeletal: Positive for back pain (chronic).  Skin: Negative.   Neurological: Negative for headaches.      Objective:   Physical Exam  Constitutional: She is oriented to person, place, and time. She appears well-developed and well-nourished. No distress.  HENT:  Head: Normocephalic and atraumatic.  Right Ear: Hearing, tympanic membrane, external ear and ear canal normal.  Left Ear: Hearing, tympanic membrane, external ear and ear canal normal.  Nose: Nose normal. No mucosal edema.  Mouth/Throat: Uvula is midline and mucous membranes are normal. Oropharyngeal exudate and posterior oropharyngeal erythema present.  Tonsils are enlarged, 2-3+  Eyes: Conjunctivae and lids are normal. Right eye exhibits no discharge. Left eye exhibits no discharge. No scleral icterus.  Cardiovascular: Regular rhythm, normal heart sounds and intact distal pulses.   Tachycardic to 102  Pulmonary/Chest: Effort normal and breath sounds normal. No respiratory distress. She has no wheezes. She has no rhonchi. She has no rales.  Lymphadenopathy:       Head (right side): No submental, no submandibular, no tonsillar and no occipital adenopathy present.       Head (left side): No submental, no submandibular, no tonsillar and no occipital adenopathy present.    She has cervical adenopathy.       Right cervical: Superficial cervical adenopathy present.       Left cervical: Superficial cervical adenopathy present.  Neurological: She  is alert and oriented to person, place, and time.  Skin: Skin is warm, dry and intact. No lesion and no rash noted.  Psychiatric: She has a normal mood and affect. Her speech is normal and behavior is normal. Thought content normal.   Results for orders placed in visit on 02/26/14  POCT RAPID STREP A (OFFICE)      Result Value Ref Range    Rapid Strep A Screen Negative  Negative      Assessment & Plan:  1. Sore throat 2. Viral URI with cough  Rapid strep negative, culture pending. This is likely a viral URI, focus is on supportive care. If still having fevers in 3 days, she should return. Otherwise, she should return in 7-10 days if cold symptoms are not improving.  - POCT rapid strep A - Culture, Group A Strep - Guaifenesin (MUCINEX MAXIMUM STRENGTH) 1200 MG TB12; Take 1 tablet (1,200 mg total) by mouth every 12 (twelve) hours as needed.  Dispense: 14 tablet; Refill: 1 - chlorpheniramine-HYDROcodone (TUSSIONEX PENNKINETIC ER) 10-8 MG/5ML LQCR; Take 5 mLs by mouth every 12 (twelve) hours as needed for cough (cough).  Dispense: 100 mL; Refill: 0 - ipratropium (ATROVENT) 0.03 % nasal spray; Place 2 sprays into both nostrils 2 (two) times daily.  Dispense: 30 mL; Refill: 0   Nicole V. Dyke BrackettBush, PA-C, [facility]  02/26/2014

## 2014-02-28 ENCOUNTER — Telehealth: Payer: Self-pay

## 2014-02-28 DIAGNOSIS — J020 Streptococcal pharyngitis: Secondary | ICD-10-CM

## 2014-02-28 LAB — CULTURE, GROUP A STREP

## 2014-02-28 NOTE — Telephone Encounter (Signed)
Pt says her uvula is very swollen and it's hard to swallow. She is feeling worse. Has been taking meds as prescribed. Unable to RTC today. Can we rx an abx? Please advise.

## 2014-02-28 NOTE — Telephone Encounter (Signed)
Patient was seen on Friday at our walk in clinic for a sore throat/fever x 5 days. Per patient the PA she saw told her if she was not better within 2 days to call. Patient states the back of her throat is very sore and swollen but no fever. Patient not sure what to do and is worried this may be a reaction to medications. I advised patient to come in to be evaluated again. Patient stated she would come in if it gets any worse. Patient requesting a nurse to call her at [phone]

## 2014-02-28 NOTE — Telephone Encounter (Signed)
Patient called stated she is not feeling better. She still is running a fever, sore throat, and barely eat/swollow. Patient want to know if she need to be seen. [phone].

## 2014-02-28 NOTE — Telephone Encounter (Signed)
Needs to RTC

## 2014-03-01 MED ORDER — CLINDAMYCIN HCL 300 MG PO CAPS: 300.0000 mg | ORAL_CAPSULE | Freq: Three times a day (TID) | ORAL | Status: AC

## 2014-03-01 NOTE — Telephone Encounter (Signed)
Pt does not want to come in. She is still not feeling well. Her medication cost too much and she does not have the money for the copay.  She wants an antibiotic called in. Please advise.

## 2014-03-01 NOTE — Telephone Encounter (Signed)
Patient's throat culture grew out Streptococcus (non- group A). A prescription for clindamycin was called in to her pharmacy. She should return in 7 days if her symptoms do not improve or at any time if her symptoms worsen.

## 2014-03-03 ENCOUNTER — Telehealth: Payer: Self-pay

## 2014-03-03 MED ORDER — HYDROCODONE-HOMATROPINE 5-1.5 MG/5ML PO SYRP: 5.0000 mL | ORAL_SOLUTION | Freq: Three times a day (TID) | ORAL | Status: DC | PRN

## 2014-03-03 NOTE — Telephone Encounter (Signed)
She was given tussionex. That's the strongest. If it's not adequate, she should RTC.

## 2014-03-03 NOTE — Telephone Encounter (Signed)
Pt states that she has taken Hydromet (hydrocodone/homatropine) in the past and it worked good for her.  Can we prescribe this for her?

## 2014-03-03 NOTE — Telephone Encounter (Signed)
Pt notified that rx is ready for pick up

## 2014-03-03 NOTE — Telephone Encounter (Signed)
Pt states she was seen for a viral infection, but was up all night long with a cough, wasn't able to sleep and needed to go to work Would like to have a stronger cough medicine called in for her and hope it will be today. Please call [phone]     [facility] [address] [place]

## 2014-03-03 NOTE — Telephone Encounter (Signed)
Rx printed.  Meds ordered this encounter  Medications  . HYDROcodone-homatropine (HYCODAN) 5-1.5 MG/5ML syrup    Sig: Take 5 mLs by mouth every 8 (eight) hours as needed for cough.    Dispense:  120 mL    Refill:  0    Order Specific Question:  Supervising Provider    Answer:  DOOLITTLE, ROBERT P [[id number]]

## 2014-04-17 ENCOUNTER — Emergency Department
Admission: EM | Admit: 2014-04-17 | Discharge: 2014-04-17 | Disposition: A | Discharge status: Home / Self Care | Payer: 59 | Source: Home / Self Care | Attending: Family Medicine | Admitting: Family Medicine

## 2014-04-17 DIAGNOSIS — B9789 Other viral agents as the cause of diseases classified elsewhere: Principal | ICD-10-CM

## 2014-04-17 DIAGNOSIS — J069 Acute upper respiratory infection, unspecified: Secondary | ICD-10-CM

## 2014-04-17 LAB — POCT RAPID STREP A (OFFICE): Rapid Strep A Screen: NEGATIVE

## 2014-04-17 MED ORDER — BENZONATATE 200 MG PO CAPS: 200.0000 mg | ORAL_CAPSULE | Freq: Every day | ORAL | Status: DC

## 2014-04-17 NOTE — Discharge Instructions (Signed)
Take plain Mucinex (1200 mg guaifenesin) twice daily for cough and congestion.  May add Sudafed for sinus congestion.   Increase fluid intake, rest. May use Afrin nasal spray (or generic oxymetazoline) twice daily for about 5 days.  Also recommend using saline nasal spray several times daily and saline nasal irrigation (AYR is a common brand).  May continue Flonase nasal spray. May continue Azithromycin. Try warm salt water gargles for sore throat.  Stop all antihistamines for now, and other non-prescription cough/cold preparations. May take Ibuprofen 200mg , 4 tabs every 8 hours with food for sore throat, headache, etc.   Follow-up with family doctor if not improving about10 days.

## 2014-04-17 NOTE — ED Notes (Signed)
Patient c/o cough and sore throat x 2 days

## 2014-04-17 NOTE — ED Provider Notes (Signed)
CSN: [id number]     Arrival date & time 04/17/14  1742 History   First MD Initiated Contact with Patient 04/17/14 1758     Chief Complaint  Patient presents with  . Cough      HPI Comments: Patient awoke with a cough, sinus congestion, fatigue, headache, and sore throat yesterday.  She started taking a Z-pack that she has on hand.  Her daughter has had similar symptoms for 4 to 5 days.  The history is provided by the patient.    Past Medical History  Diagnosis Date  . Anxiety   . Asthma     sports induced   Past Surgical History  Procedure Laterality Date  . Cholecystectomy     Family History  Problem Relation Age of Onset  . Other Neg Hx   . Hypertension Mother    History  Substance Use Topics  . Smoking status: Former Smoker    Quit date: 07/01/2010  . Smokeless tobacco: Never Used  . Alcohol Use: Yes     Comment: occasional use   OB History    Gravida Para Term Preterm AB TAB SAB Ectopic Multiple Living   2 1 1  1  1    0     Review of Systems + sore throat + cough No pleuritic pain No wheezing + nasal congestion + post-nasal drainage No sinus pain/pressure No itchy/red eyes No earache No hemoptysis No SOB No fever/chills No nausea No vomiting No abdominal pain No diarrhea No urinary symptoms No skin rash + fatigue No myalgias + headache Used OTC meds without relief  Allergies  Penicillins and Ciprofloxacin  Home Medications   Prior to Admission medications   Medication Sig Start Date End Date Taking? Authorizing Provider  Guaifenesin (MUCINEX MAXIMUM STRENGTH) 1200 MG TB12 Take 1 tablet (1,200 mg total) by mouth every 12 (twelve) hours as needed. 02/26/14  Yes Lanier ClamNicole Bush V, PA-C  ipratropium (ATROVENT) 0.03 % nasal spray Place 2 sprays into both nostrils 2 (two) times daily. 02/26/14  Yes Lanier ClamNicole Bush V, PA-C  meloxicam (MOBIC) 7.5 MG tablet Take 7.5 mg by mouth 2 (two) times daily.   Yes Historical Provider, MD  norgestimate-ethinyl  estradiol (ORTHO-CYCLEN,SPRINTEC,PREVIFEM) 0.25-35 MG-MCG tablet Take 1 tablet by mouth daily.   Yes Historical Provider, MD  benzonatate (TESSALON) 200 MG capsule Take 1 capsule (200 mg total) by mouth at bedtime. Take as needed for cough 04/17/14   Lattie HawStephen A Artha Chiasson, MD   BP 129/89 mmHg  Pulse 119  Temp(Src) 97.7 F (36.5 C) (Oral)  Ht 5\' 5"  (1.651 m)  Wt 149 lb (67.586 kg)  BMI 24.79 kg/m2  SpO2 98% Physical Exam Nursing notes and Vital Signs reviewed. Appearance:  Patient appears healthy, stated age, and in no acute distress Eyes:  Pupils are equal, round, and reactive to light and accomodation.  Extraocular movement is intact.  Conjunctivae are not inflamed  Ears:  Canals normal.  Tympanic membranes normal.  Nose:  Mildly congested turbinates.  No sinus tenderness.  Pharynx:  Normal Neck:  Supple.  Tender shotty posterior nodes are palpated bilaterally  Lungs:  Clear to auscultation.  Breath sounds are equal.  Heart:  Regular rate and rhythm without murmurs, rubs, or gallops.  Abdomen:  Nontender without masses or hepatosplenomegaly.  Bowel sounds are present.  No CVA or flank tenderness.  Extremities:  No edema.  No calf tenderness Skin:  No rash present.   ED Course  Procedures  None    Labs  Reviewed  POCT RAPID STREP A (OFFICE) - Normal         MDM   1. Viral URI with cough    There is no evidence of bacterial infection today.   Prescription written for Benzonatate [facility](Tessalon) to take at bedtime for night-time cough. Take plain Mucinex (1200 mg guaifenesin) twice daily for cough and congestion.  May add Sudafed for sinus congestion.   Increase fluid intake, rest. May use Afrin nasal spray (or generic oxymetazoline) twice daily for about 5 days.  Also recommend using saline nasal spray several times daily and saline nasal irrigation (AYR is a common brand).  May continue Flonase nasal spray. May continue Azithromycin. Try warm salt water gargles for sore throat.  Stop  all antihistamines for now, and other non-prescription cough/cold preparations. May take Ibuprofen 200mg , 4 tabs every 8 hours with food for sore throat, headache, etc.   Follow-up with family doctor if not improving about10 days.     Lattie HawStephen A Romana Deaton, MD 04/25/14 [phone]

## 2014-11-14 ENCOUNTER — Telehealth: Payer: Self-pay

## 2014-11-14 NOTE — Telephone Encounter (Signed)
Cindy Rice phoned in stating that she just got back from the beach and is having symptoms of a yeast infection. She has them quite often and her OBGYN or Dr. Dareen PianoAnderson always prescribes Diflucan. She wanted to know if she could receive this medication again without having to come in for an office visit.

## 2014-11-15 NOTE — Telephone Encounter (Signed)
RTC. Called pt to let her know. Pt understood.

## 2015-03-31 ENCOUNTER — Ambulatory Visit (INDEPENDENT_AMBULATORY_CARE_PROVIDER_SITE_OTHER): Payer: 59 | Admitting: Emergency Medicine

## 2015-03-31 VITALS — BP 104/64 | HR 106 | Temp 98.4°F | Resp 17 | Ht 64.5 in | Wt 130.0 lb

## 2015-03-31 DIAGNOSIS — J209 Acute bronchitis, unspecified: Secondary | ICD-10-CM

## 2015-03-31 DIAGNOSIS — J0140 Acute pansinusitis, unspecified: Secondary | ICD-10-CM | POA: Diagnosis not present

## 2015-03-31 MED ORDER — AMOXICILLIN-POT CLAVULANATE 875-125 MG PO TABS: 1.0000 | ORAL_TABLET | Freq: Two times a day (BID) | ORAL | Status: DC

## 2015-03-31 MED ORDER — HYDROCOD POLST-CPM POLST ER 10-8 MG/5ML PO SUER: 5.0000 mL | Freq: Two times a day (BID) | ORAL | Status: DC

## 2015-03-31 MED ORDER — FLUTICASONE PROPIONATE 50 MCG/ACT NA SUSP: 2.0000 | Freq: Every day | NASAL | Status: DC

## 2015-03-31 MED ORDER — ONDANSETRON 8 MG PO TBDP: 8.0000 mg | ORAL_TABLET | Freq: Three times a day (TID) | ORAL | Status: DC | PRN

## 2015-03-31 NOTE — Patient Instructions (Signed)

## 2015-03-31 NOTE — Progress Notes (Signed)
Subjective:  Patient ID: Cindy Rice, female    DOB: 02-Jun-1985  Age: 29 y.o. MRN: [id number]  CC: Cough; Cerumen Impaction; Nasal Congestion; and Sore Throat   HPI Cindy Rice presents   Patient works in a pediatric office. She has nasal congeston and a purulent nasal drainage. She has postnasal drainage. And sore throat. She has a nonproductive cough. She has no wheezing or shortness of breath. She has no nausea vomiting or stool change. She's been unable to control her symptoms with over-the-counter medication she has a child  And she was treated for upper respiratory infection about 2 weeks ago  History Cindy Rice has a past medical history of Anxiety and Asthma.   She has past surgical history that includes Cholecystectomy.   Her  family history includes Hypertension in her mother. There is no history of Other.  She   reports that she quit smoking about 4 years ago. She has never used smokeless tobacco. She reports that she drinks alcohol. She reports that she does not use illicit drugs.  Outpatient Prescriptions Prior to Visit  Medication Sig Dispense Refill  . Guaifenesin (MUCINEX MAXIMUM STRENGTH) 1200 MG TB12 Take 1 tablet (1,200 mg total) by mouth every 12 (twelve) hours as needed. 14 tablet 1  . ipratropium (ATROVENT) 0.03 % nasal spray Place 2 sprays into both nostrils 2 (two) times daily. (Patient not taking: Reported on 03/31/2015) 30 mL 0  . norgestimate-ethinyl estradiol (ORTHO-CYCLEN,SPRINTEC,PREVIFEM) 0.25-35 MG-MCG tablet Take 1 tablet by mouth daily.    . benzonatate (TESSALON) 200 MG capsule Take 1 capsule (200 mg total) by mouth at bedtime. Take as needed for cough 12 capsule 0  . meloxicam (MOBIC) 7.5 MG tablet Take 7.5 mg by mouth 2 (two) times daily.     No facility-administered medications prior to visit.    Social History   Social History  . Marital Status: Married    Spouse Name: N/A  . Number of Children: N/A  . Years of Education: N/A    Social History Main Topics  . Smoking status: Former Smoker    Quit date: 07/01/2010  . Smokeless tobacco: Never Used  . Alcohol Use: Yes     Comment: occasional use  . Drug Use: No  . Sexual Activity: Yes    Birth Control/ Protection: None   Other Topics Concern  . None   Social History Narrative     Review of Systems  Constitutional: Negative for fever, chills and appetite change.  HENT: Positive for congestion, postnasal drip, rhinorrhea, sinus pressure and sore throat. Negative for ear pain.   Eyes: Negative for pain and redness.  Respiratory: Positive for cough. Negative for shortness of breath and wheezing.   Cardiovascular: Negative for leg swelling.  Gastrointestinal: Negative for nausea, vomiting, abdominal pain, diarrhea, constipation and blood in stool.  Endocrine: Negative for polyuria.  Genitourinary: Negative for dysuria, urgency, frequency and flank pain.  Musculoskeletal: Negative for gait problem.  Skin: Negative for rash.  Neurological: Negative for weakness and headaches.  Psychiatric/Behavioral: Negative for confusion and decreased concentration. The patient is not nervous/anxious.     Objective:  BP 104/64 mmHg  Pulse 106  Temp(Src) 98.4 F (36.9 C) (Oral)  Resp 17  Ht 5' 4.5" (1.638 m)  Wt 130 lb (58.968 kg)  BMI 21.98 kg/m2  SpO2 98%  LMP 03/12/2015  Physical Exam  Constitutional: She is oriented to person, place, and time. She appears well-developed and well-nourished. No distress.  HENT:  Head:  Normocephalic and atraumatic.  Right Ear: External ear normal.  Left Ear: External ear normal.  Nose: Nose normal.  Eyes: Conjunctivae and EOM are normal. Pupils are equal, round, and reactive to light. No scleral icterus.  Neck: Normal range of motion. Neck supple. No tracheal deviation present.  Cardiovascular: Normal rate, regular rhythm and normal heart sounds.   Pulmonary/Chest: Effort normal. No respiratory distress. She has no wheezes.  She has no rales.  Abdominal: She exhibits no mass. There is no tenderness. There is no rebound and no guarding.  Musculoskeletal: She exhibits no edema.  Lymphadenopathy:    She has no cervical adenopathy.  Neurological: She is alert and oriented to person, place, and time. Coordination normal.  Skin: Skin is warm and dry. No rash noted.  Psychiatric: She has a normal mood and affect. Her behavior is normal.      Assessment & Plan:   Cindy Rice was seen today for cough, cerumen impaction, nasal congestion and sore throat.  Diagnoses and all orders for this visit:  Acute pansinusitis, recurrence not specified  Acute bronchitis, unspecified organism  Other orders -     amoxicillin-clavulanate (AUGMENTIN) 875-125 MG tablet; Take 1 tablet by mouth 2 (two) times daily. -     chlorpheniramine-HYDROcodone (TUSSIONEX PENNKINETIC ER) 10-8 MG/5ML SUER; Take 5 mLs by mouth 2 (two) times daily. -     fluticasone (FLONASE) 50 MCG/ACT nasal spray; Place 2 sprays into both nostrils daily. -     ondansetron (ZOFRAN-ODT) 8 MG disintegrating tablet; Take 1 tablet (8 mg total) by mouth every 8 (eight) hours as needed for nausea.  I have discontinued Cindy Rice's meloxicam and benzonatate. I am also having her start on amoxicillin-clavulanate, chlorpheniramine-HYDROcodone, fluticasone, and ondansetron. Additionally, I am having her maintain her norgestimate-ethinyl estradiol, Guaifenesin, and ipratropium.  Meds ordered this encounter  Medications  . amoxicillin-clavulanate (AUGMENTIN) 875-125 MG tablet    Sig: Take 1 tablet by mouth 2 (two) times daily.    Dispense:  20 tablet    Refill:  0  . chlorpheniramine-HYDROcodone (TUSSIONEX PENNKINETIC ER) 10-8 MG/5ML SUER    Sig: Take 5 mLs by mouth 2 (two) times daily.    Dispense:  60 mL    Refill:  0  . fluticasone (FLONASE) 50 MCG/ACT nasal spray    Sig: Place 2 sprays into both nostrils daily.    Dispense:  16 g    Refill:  6  . ondansetron  (ZOFRAN-ODT) 8 MG disintegrating tablet    Sig: Take 1 tablet (8 mg total) by mouth every 8 (eight) hours as needed for nausea.    Dispense:  30 tablet    Refill:  0    Appropriate red flag conditions were discussed with the patient as well as actions that should be taken.  Patient expressed his understanding.  Follow-up: Return if symptoms worsen or fail to improve.  Carmelina Dane, MD

## 2015-04-02 ENCOUNTER — Telehealth: Payer: Self-pay

## 2015-04-02 NOTE — Telephone Encounter (Signed)
Pt is wanting to get a different cough medication the tussionex is to stronger for her during the day and she believes that she is getting a yeast infection and is needing diflucan    Best number [phone]

## 2015-04-03 NOTE — Telephone Encounter (Signed)
Dr. Dareen PianoAnderson advised patient to take robitussin dm. Patient states that it's been suggested by her co workers that she takes hydromet for her cough. Patient also needs something for yeast infection and and alternative for Flonase because the pharmacy is out.

## 2015-04-03 NOTE — Telephone Encounter (Signed)
Patient is calling back to follow up on previous message since she hasn't heard anything. She states that she needs a different cough medication, she's getting a yeast infection, and the pharmacy ran out of Flonase. Please call patient!

## 2015-04-04 ENCOUNTER — Other Ambulatory Visit: Payer: Self-pay | Admitting: Physician Assistant

## 2015-04-04 ENCOUNTER — Other Ambulatory Visit: Payer: Self-pay | Admitting: Emergency Medicine

## 2015-04-04 ENCOUNTER — Other Ambulatory Visit: Payer: Self-pay

## 2015-04-04 DIAGNOSIS — R059 Cough, unspecified: Secondary | ICD-10-CM

## 2015-04-04 DIAGNOSIS — R05 Cough: Secondary | ICD-10-CM

## 2015-04-04 MED ORDER — TRIAMCINOLONE ACETONIDE 55 MCG/ACT NA AERO: 2.0000 | INHALATION_SPRAY | Freq: Every day | NASAL | Status: DC

## 2015-04-04 MED ORDER — FLUCONAZOLE 150 MG PO TABS: 150.0000 mg | ORAL_TABLET | Freq: Once | ORAL | Status: DC

## 2015-04-04 MED ORDER — HYDROCOD POLST-CPM POLST ER 10-8 MG/5ML PO SUER: 5.0000 mL | Freq: Two times a day (BID) | ORAL | Status: DC | PRN

## 2015-04-04 MED ORDER — HYDROCOD POLST-CPM POLST ER 10-8 MG/5ML PO SUER: 5.0000 mL | Freq: Two times a day (BID) | ORAL | Status: DC

## 2015-04-04 NOTE — Telephone Encounter (Signed)
I gave the patient a prescription for tussionex.  I have sent in a prescription for diflucan and nasacort.

## 2015-04-04 NOTE — Telephone Encounter (Signed)
Left message for pt to call back  °

## 2015-04-04 NOTE — Telephone Encounter (Signed)
Prescriptions printed

## 2015-04-04 NOTE — Progress Notes (Signed)
Refill at the request of fellow colleagues.  Advised to take less dose.  If she continues to need medication, she will need to return to the office for recheck.  She is overusing, and if it is not helping, we may need to search for another solution.

## 2015-04-04 NOTE — Telephone Encounter (Signed)
Pt called and is really needing another cough medication tonight. She does not want anything that is sedating. Dr. Dareen PianoAnderson is gone for the night. Are we able to prescribe her Hydromet?

## 2015-04-04 NOTE — Telephone Encounter (Signed)
Spoke to pt who stated that she has tessalon perles from an earlier illness and it is not helping at all. She is using the Tussionex at night, but too sedating during the day. Her daughter's pediatrician suggested that Hydromet might be effective, but less sedating than tussionex, and she would like to try that for daytime if possible. (I have shredded the new Tussionex Rx and sent the nasacort electronically).

## 2015-04-04 NOTE — Telephone Encounter (Signed)
She wants something that will not make her drowsy. Maybe tessalon pearles.

## 2015-04-04 NOTE — Telephone Encounter (Signed)
Advised pt, she would like a cough medicine for day time. She only takes the cough syrup at night. Please advise.

## 2015-04-04 NOTE — Telephone Encounter (Signed)
Hi Callie,  Hydromet would be more sedating as it contains hydrocodone and an anticholinergic.  She should try delsym, as the studies do support its efficacy, or she could just try taking less of the hycodan.  Deliah BostonMichael Clark, MS, PA-C 7:13 PM, 04/04/2015

## 2015-04-20 ENCOUNTER — Ambulatory Visit (INDEPENDENT_AMBULATORY_CARE_PROVIDER_SITE_OTHER): Payer: 59

## 2015-04-20 ENCOUNTER — Ambulatory Visit (INDEPENDENT_AMBULATORY_CARE_PROVIDER_SITE_OTHER): Payer: 59 | Admitting: Family Medicine

## 2015-04-20 VITALS — BP 140/90 | HR 105 | Temp 98.0°F | Resp 18 | Ht 65.0 in | Wt 135.4 lb

## 2015-04-20 DIAGNOSIS — S300XXA Contusion of lower back and pelvis, initial encounter: Secondary | ICD-10-CM

## 2015-04-20 MED ORDER — HYDROCODONE-ACETAMINOPHEN 5-325 MG PO TABS: 1.0000 | ORAL_TABLET | Freq: Four times a day (QID) | ORAL | Status: DC | PRN

## 2015-04-20 NOTE — Patient Instructions (Addendum)
You need to ice the area this evening and tomorrow.  It will probably be several days before sitting is not painful.

## 2015-04-20 NOTE — Progress Notes (Signed)
 @[facility] @  By signing my name below, I, Raven Small, attest that this documentation has been prepared under the direction and in the presence of Elvina SidleKurt Erminio Nygard, MD.  Electronically Signed: Andrew Auaven Small, ED Scribe. 04/20/2015. 8:29 PM.  Patient ID: Cindy Rice MRN: [id number], DOB: Sep 08, 1985, 29 y.o. Date of Encounter: 04/20/2015, 8:28 PM  Primary Physician: Carmelina DaneAnderson, Jeffery S, MD  Chief Complaint:  Chief Complaint  Patient presents with  . OTHER    fell today, tailbone hurts    HPI: 29 y.o. year old female with history below complaining of a fall that occurred 1 hour ago. Pt states her foot slipped causing her to fall down six steps. She now has pain to sacral area. Pt is able to walk but has some pain.  Pt denies the chance of pregnancy.   Past Medical History  Diagnosis Date  . Anxiety   . Asthma     sports induced     Home Meds: Prior to Admission medications   Medication Sig Start Date End Date Taking? Authorizing Provider  amoxicillin-clavulanate (AUGMENTIN) 875-125 MG tablet Take 1 tablet by mouth 2 (two) times daily. 03/31/15  Yes Carmelina DaneJeffery S Anderson, MD  chlorpheniramine-HYDROcodone [facility](TUSSIONEX PENNKINETIC ER) 10-8 MG/5ML SUER Take 5 mLs by mouth 2 (two) times daily. 03/31/15  Yes Carmelina DaneJeffery S Anderson, MD  chlorpheniramine-HYDROcodone [facility](TUSSIONEX PENNKINETIC ER) 10-8 MG/5ML SUER Take 5 mLs by mouth 2 (two) times daily. 04/04/15  Yes Carmelina DaneJeffery S Anderson, MD  chlorpheniramine-HYDROcodone [facility](TUSSIONEX PENNKINETIC ER) 10-8 MG/5ML SUER Take 5 mLs by mouth every 12 (twelve) hours as needed for cough. 04/04/15  Yes Collie SiadStephanie D English, PA  fluconazole (DIFLUCAN) 150 MG tablet Take 1 tablet (150 mg total) by mouth once. Repeat if needed 04/04/15  Yes Carmelina DaneJeffery S Anderson, MD  fluticasone [facility](FLONASE) 50 MCG/ACT nasal spray Place 2 sprays into both nostrils daily. 03/31/15  Yes Carmelina DaneJeffery S Anderson, MD  Guaifenesin [facility](MUCINEX MAXIMUM STRENGTH) 1200 MG TB12 Take 1 tablet (1,200 mg total) by  mouth every 12 (twelve) hours as needed. 02/26/14  Yes Lanier ClamNicole Bush V, PA-C  ipratropium (ATROVENT) 0.03 % nasal spray Place 2 sprays into both nostrils 2 (two) times daily. 02/26/14  Yes Lanier ClamNicole Bush V, PA-C  norgestimate-ethinyl estradiol (ORTHO-CYCLEN,SPRINTEC,PREVIFEM) 0.25-35 MG-MCG tablet Take 1 tablet by mouth daily.   Yes Historical Provider, MD  ondansetron (ZOFRAN-ODT) 8 MG disintegrating tablet Take 1 tablet (8 mg total) by mouth every 8 (eight) hours as needed for nausea. 03/31/15  Yes Carmelina DaneJeffery S Anderson, MD  triamcinolone (NASACORT AQ) 55 MCG/ACT AERO nasal inhaler Place 2 sprays into the nose daily. 04/04/15  Yes Carmelina DaneJeffery S Anderson, MD    Allergies:  Allergies  Allergen Reactions  . Penicillins Other (See Comments)    Migraines   . Ciprofloxacin Nausea And Vomiting    Social History   Social History  . Marital Status: Married    Spouse Name: N/A  . Number of Children: N/A  . Years of Education: N/A   Occupational History  . Not on file.   Social History Main Topics  . Smoking status: Former Smoker    Quit date: 07/01/2010  . Smokeless tobacco: Never Used  . Alcohol Use: Yes     Comment: occasional use  . Drug Use: No  . Sexual Activity: Yes    Birth Control/ Protection: None   Other Topics Concern  . Not on file   Social History Narrative     Review of Systems: Constitutional: negative for chills, fever, night sweats,  weight changes, or fatigue  HEENT: negative for vision changes, hearing loss, congestion, rhinorrhea, ST, epistaxis, or sinus pressure Cardiovascular: negative for chest pain or palpitations Respiratory: negative for hemoptysis, wheezing, shortness of breath, or cough Abdominal: negative for abdominal pain, nausea, vomiting, diarrhea, or constipation Dermatological: negative for rash Neurologic: negative for headache, dizziness, or syncope All other systems reviewed and are otherwise negative with the exception to those above and in the  HPI.   Physical Exam: Blood pressure 140/90, pulse 105, temperature 98 F (36.7 C), temperature source Oral, resp. rate 18, height  (1.651 m), weight 135 lb 6.4 oz (61.417 kg), last menstrual period 03/12/2015, SpO2 97 %., Body mass index is 22.53 kg/(m^2). General: Well developed, well nourished, in no acute distress. Head: Normocephalic, atraumatic, eyes without discharge, sclera non-icteric, nares are without discharge. Bilateral auditory canals clear, TM's are without perforation, pearly grey and translucent with reflective cone of light bilaterally. Oral cavity moist, posterior pharynx without exudate, erythema, peritonsillar abscess, or post nasal drip.  Msk:  Strength and tone normal for age. Extremities/Skin: Warm and dry. No clubbing or cyanosis. No edema. No rashes or suspicious lesions. large 6-8 cm ecchymotic area to upper sacral lumbar area.  Neuro: Alert and oriented X 3. Moves all extremities spontaneously. Gait is normal. CNII-XII grossly in tact. Psych:  Responds to questions appropriately with a normal affect.   [facility] reading (PRIMARY) by Dr. Milus Glazier. Sacrum: No dislocation. Negative for fracture. Soft tissue swelling only.   ASSESSMENT AND PLAN:  29 y.o. year old female with contusion to sacrum This chart was scribed in my presence and reviewed by me personally.    ICD-9-CM ICD-10-CM   1. Contusion of sacrum, initial encounter 922.32 S30.0XXA DG Sacrum/Coccyx     HYDROcodone-acetaminophen (NORCO) 5-325 MG tablet     patient 2 ice the area. She's given a note to be out of work for for couple days.   Signed, Elvina Sidle, MD 04/20/2015 8:28 PM

## 2015-04-21 MED ORDER — OXYCODONE-ACETAMINOPHEN 5-325 MG PO TABS: 1.0000 | ORAL_TABLET | Freq: Three times a day (TID) | ORAL | Status: DC | PRN

## 2015-04-21 NOTE — Progress Notes (Signed)
Received a call from Dr. Elbert EwingsL regarding this pt- her pain has not been controlled by the hydrocodone- she called him this am requesting something stronger to relieve her pain as she did not sleep last night.    I will provide a small supply of percocet for her- she knows to pick this up

## 2015-04-21 NOTE — Addendum Note (Signed)
Addended by: Abbe AmsterdamOPLAND, Gean Laursen C on: 04/21/2015 08:13 AM   Modules accepted: Orders

## 2015-04-25 ENCOUNTER — Ambulatory Visit (INDEPENDENT_AMBULATORY_CARE_PROVIDER_SITE_OTHER): Payer: 59 | Admitting: Physician Assistant

## 2015-04-25 VITALS — BP 108/64 | HR 91 | Temp 98.8°F | Resp 17 | Ht 65.5 in | Wt 135.0 lb

## 2015-04-25 DIAGNOSIS — S300XXD Contusion of lower back and pelvis, subsequent encounter: Secondary | ICD-10-CM

## 2015-04-25 MED ORDER — HYDROCODONE-ACETAMINOPHEN 5-325 MG PO TABS: 1.0000 | ORAL_TABLET | Freq: Four times a day (QID) | ORAL | Status: DC | PRN

## 2015-04-25 MED ORDER — MELOXICAM 15 MG PO TABS: 15.0000 mg | ORAL_TABLET | Freq: Every day | ORAL | Status: DC

## 2015-04-25 NOTE — Progress Notes (Signed)
[facility] [address], [place] [id number] [phone]  Date:  04/25/2015   Name:  Cindy SladeKrystin E Rice   DOB:  Apr 15, 1986   MRN:  [id number]  PCP:  Carmelina DaneAnderson, Jeffery S, MD    Chief Complaint: Back Injury   History of Present Illness:  This is a 29 y.o. female who is presenting for follow up sacrum contusion. Injury occurred on 12/21 after falling down stairs. She was seen here that day by Dr. Milus GlazierLauenstein. Radiograph negative. She was told to take meloxicam during the day and vicodin as needed for break through pain. She called the following day stating she needed something stronger as she was unable to sleep d/t pain. Percocet was called in. She takes this only at night because too strong during the day. Vicodin during the day was helpful but ran out of this. Pt states overall her pain is improved but only minimally. She feels the swelling is a little worse. She states she pushed it over the weekend since it was Christmas and is in more pain today. She denies paresthesias, weakness, shooting pain into legs, problems with bowel/bladder. Pt states she has a history of low back pain. She gets SI injections 2-3 times a year. Dr. Oneal GroutGarvin in [place] these.  Review of Systems:  Review of Systems See HPI  Patient Active Problem List   Diagnosis Date Noted  . Chronic low back pain 09/06/2013    Prior to Admission medications   Medication Sig Start Date End Date Taking? Authorizing Provider  oxyCODONE-acetaminophen (ROXICET) 5-325 MG tablet Take 1 tablet by mouth every 8 (eight) hours as needed for severe pain. 04/21/15  Yes Pearline CablesJessica C Copland, MD       Elvina SidleKurt Lauenstein, MD       Historical Provider, MD    Allergies  Allergen Reactions  . Penicillins Other (See Comments)    Migraines   . Ciprofloxacin Nausea And Vomiting    Past Surgical History  Procedure Laterality Date  . Cholecystectomy      Social History  Substance Use Topics  . Smoking  status: Former Smoker    Quit date: 07/01/2010  . Smokeless tobacco: Never Used  . Alcohol Use: Yes     Comment: occasional use    Family History  Problem Relation Age of Onset  . Other Neg Hx   . Hypertension Mother     Medication list has been reviewed and updated.  Physical Examination:  Physical Exam  Constitutional: She is oriented to person, place, and time. She appears well-developed and well-nourished. No distress.  HENT:  Head: Normocephalic and atraumatic.  Right Ear: Hearing normal.  Left Ear: Hearing normal.  Nose: Nose normal.  Eyes: Conjunctivae and lids are normal. Right eye exhibits no discharge. Left eye exhibits no discharge. No scleral icterus.  Pulmonary/Chest: Effort normal. No respiratory distress.  Musculoskeletal:       Lumbar back: She exhibits decreased range of motion (due to pain), tenderness, bony tenderness and swelling (and ecchymosis). She exhibits normal pulse.       Back:  Neurological: She is alert and oriented to person, place, and time. She has normal strength and normal reflexes. No sensory deficit. Gait normal.  Skin: Skin is warm, dry and intact.  Psychiatric: She has a normal mood and affect. Her speech is normal and behavior is normal. Thought content normal.    BP 108/64 mmHg  Pulse 91  Temp(Src) 98.8 F (37.1 C) (Oral)  Resp 17  Ht 5' 5.5" (1.664 m)  Wt 135 lb (61.236 kg)  BMI 22.12 kg/m2  SpO2 99%  LMP 04/15/2015  Assessment and Plan:  1. Contusion of sacrum, subsequent encounter Continue meloxicam during the day. Gave #15 vicodin to use for break through pain. Advised she get a donut cushion to make sitting more comfortable. Gave note to return to work on 12/28. Counseled that pain would improve gradually over several weeks. She will return if pain not improving in 1 month or sooner if symptoms change/worsen. - HYDROcodone-acetaminophen (NORCO) 5-325 MG tablet; Take 1 tablet by mouth every 6 (six) hours as needed for  moderate pain.  Dispense: 15 tablet; Refill: 0 - meloxicam (MOBIC) 15 MG tablet; Take 1 tablet (15 mg total) by mouth daily.  Dispense: 30 tablet; Refill: 0   [employer] [employer]. Dyke Brackett, MHS [facility]  04/25/2015

## 2015-04-25 NOTE — Patient Instructions (Signed)
Take meloxicam during the day. Vicodin only as needed. Symptoms will gradually get better over weeks.  Return in 1 month if symptoms not improved. Continue icing the area.

## 2015-04-26 ENCOUNTER — Telehealth: Payer: Self-pay

## 2015-04-26 DIAGNOSIS — S300XXA Contusion of lower back and pelvis, initial encounter: Secondary | ICD-10-CM

## 2015-04-26 NOTE — Telephone Encounter (Signed)
Patient was seen after falling down the stairs.  She went back to work and the vicodin is not strong enough.  She is requesting more percoset.  She is not out yet, would like more in order to work part time.    Sitting on a cushion.   [phone].

## 2015-04-28 MED ORDER — OXYCODONE-ACETAMINOPHEN 5-325 MG PO TABS: 1.0000 | ORAL_TABLET | Freq: Three times a day (TID) | ORAL | Status: DC | PRN

## 2015-04-28 NOTE — Telephone Encounter (Signed)
Pt is checking status of this message. She is a lot of pain while sitting at her job. CB # below.

## 2015-04-28 NOTE — Telephone Encounter (Signed)
Pt is back to work and very uncomfortable. She states the vicodin is not strong enough. She tried taking 1.5 vicodin but gave her a severe headache. She took perocet once and helped, didn't make her too drowsy. Husband has been driving her to and from work. She is sitting on a donut cushion at work She started taking mobic 15 mg instead of 7.5... She states this is giving her diarrhea. She will go back to 7.5 mg. She is needing a refill of the percocet.  Will give her 1 more refill of percocet - when she runs out if she is still in severe pain, she will need to come back in for eval. Husband, Raynelle BringShawn Men, will be picking up percocet script.

## 2015-06-19 ENCOUNTER — Encounter (HOSPITAL_BASED_OUTPATIENT_CLINIC_OR_DEPARTMENT_OTHER): Payer: Self-pay | Admitting: Emergency Medicine

## 2015-06-19 ENCOUNTER — Emergency Department (HOSPITAL_BASED_OUTPATIENT_CLINIC_OR_DEPARTMENT_OTHER): Payer: 59

## 2015-06-19 ENCOUNTER — Emergency Department (HOSPITAL_BASED_OUTPATIENT_CLINIC_OR_DEPARTMENT_OTHER)
Admission: EM | Admit: 2015-06-19 | Discharge: 2015-06-19 | Disposition: A | Discharge status: Home / Self Care | Payer: 59 | Attending: Emergency Medicine | Admitting: Emergency Medicine

## 2015-06-19 DIAGNOSIS — Z87891 Personal history of nicotine dependence: Secondary | ICD-10-CM | POA: Diagnosis not present

## 2015-06-19 DIAGNOSIS — W108XXA Fall (on) (from) other stairs and steps, initial encounter: Secondary | ICD-10-CM | POA: Diagnosis not present

## 2015-06-19 DIAGNOSIS — Y998 Other external cause status: Secondary | ICD-10-CM | POA: Insufficient documentation

## 2015-06-19 DIAGNOSIS — Z8659 Personal history of other mental and behavioral disorders: Secondary | ICD-10-CM | POA: Diagnosis not present

## 2015-06-19 DIAGNOSIS — Y9389 Activity, other specified: Secondary | ICD-10-CM | POA: Diagnosis not present

## 2015-06-19 DIAGNOSIS — Y92009 Unspecified place in unspecified non-institutional (private) residence as the place of occurrence of the external cause: Secondary | ICD-10-CM | POA: Insufficient documentation

## 2015-06-19 DIAGNOSIS — Z8781 Personal history of (healed) traumatic fracture: Secondary | ICD-10-CM | POA: Diagnosis not present

## 2015-06-19 DIAGNOSIS — S6992XA Unspecified injury of left wrist, hand and finger(s), initial encounter: Secondary | ICD-10-CM | POA: Diagnosis present

## 2015-06-19 DIAGNOSIS — J45909 Unspecified asthma, uncomplicated: Secondary | ICD-10-CM | POA: Diagnosis not present

## 2015-06-19 DIAGNOSIS — S60212A Contusion of left wrist, initial encounter: Secondary | ICD-10-CM

## 2015-06-19 DIAGNOSIS — Z793 Long term (current) use of hormonal contraceptives: Secondary | ICD-10-CM | POA: Insufficient documentation

## 2015-06-19 DIAGNOSIS — Z880 Allergy status to penicillin: Secondary | ICD-10-CM | POA: Diagnosis not present

## 2015-06-19 DIAGNOSIS — Z791 Long term (current) use of non-steroidal anti-inflammatories (NSAID): Secondary | ICD-10-CM | POA: Insufficient documentation

## 2015-06-19 MED ORDER — OXYCODONE-ACETAMINOPHEN 5-325 MG PO TABS
1.0000 | ORAL_TABLET | Freq: Once | ORAL | Status: AC
  Administered 2015-06-19: 1 via ORAL
  Filled 2015-06-19: qty 1

## 2015-06-19 MED ORDER — OXYCODONE-ACETAMINOPHEN 5-325 MG PO TABS: 1.0000 | ORAL_TABLET | Freq: Four times a day (QID) | ORAL | Status: DC | PRN

## 2015-06-19 NOTE — Discharge Instructions (Signed)
Return to the ED with any concerns including swelling/discoloration/numbness of fingers or hand, or any other alarming symptoms

## 2015-06-19 NOTE — ED Notes (Signed)
Family at bedside. 

## 2015-06-19 NOTE — ED Notes (Signed)
DC instructions reviewed with pt and husband, discussed splint application done in ED, also discussed rest, ice use, comfort measures with ibuprofen and elevation all to aid in pain control. Also provided information for making a follow up appointment with Ortho MD as per EDP recommendations. Name, address and phone number of Ortho MD provided. Teach Back Method used, opportunity for questions provided

## 2015-06-19 NOTE — ED Notes (Signed)
Awaiting x-ray report for EDP

## 2015-06-19 NOTE — ED Notes (Signed)
Pt in c/o pain and bruising to L wrist following a fall down the steps in her house approx 1 hour PTA. Bruising and discoloration noted.

## 2015-06-19 NOTE — ED Notes (Signed)
Good capillary refill and motor sensory noted. Large bruise/discoloration noted at left wrist, thumb area

## 2015-06-19 NOTE — ED Provider Notes (Signed)
CSN: [id number]     Arrival date & time 06/19/15  1704 History  By signing my name below, I, Linus Galas, attest that this documentation has been prepared under the direction and in the presence of No att. providers found. Electronically Signed: Linus Galas, ED Scribe. 06/19/2015. 6:34 PM.   Chief Complaint  Patient presents with  . Fall  . Wrist Injury   Patient is a 30 y.o. female presenting with wrist injury. The history is provided by the patient. No language interpreter was used.  Wrist Injury Location:  Wrist Time since incident:  1 hour Wrist location:  L wrist Pain details:    Quality:  Dull   Radiates to:  Does not radiate   Severity:  Mild   Onset quality:  Sudden   Progression:  Unchanged Chronicity:  New Foreign body present:  No foreign bodies Prior injury to area:  No Relieved by:  Nothing Worsened by:  Nothing tried Ineffective treatments:  None tried Associated symptoms: no back pain, no fever, no neck pain, no numbness and no tingling    HPI Comments: Cindy Rice is a 30 y.o. female who presents to the Emergency Department complaining of left wrist injury s/p fall down her steps at home 1 hour PTA. Pt denies any LOC, headache, numbness, tingling, weakness, or any other symptoms at this time. Pt states she is recovering from a a "hairline sacrum fracture" from a fall in December 2016.   Past Medical History  Diagnosis Date  . Anxiety   . Asthma     sports induced   Past Surgical History  Procedure Laterality Date  . Cholecystectomy     Family History  Problem Relation Age of Onset  . Other Neg Hx   . Hypertension Mother    Social History  Substance Use Topics  . Smoking status: Former Smoker    Quit date: 07/01/2010  . Smokeless tobacco: Never Used  . Alcohol Use: Yes     Comment: occasional use   OB History    Gravida Para Term Preterm AB TAB SAB Ectopic Multiple Living   0     Review of Systems  Constitutional:  Negative for fever.  Musculoskeletal: Negative for back pain and neck pain.       + wrist pain  Neurological: Negative for weakness and numbness.  Psychiatric/Behavioral: Negative for confusion.  All other systems reviewed and are negative.  Allergies  Penicillins and Ciprofloxacin  Home Medications   Prior to Admission medications   Medication Sig Start Date End Date Taking? Authorizing Provider  HYDROcodone-acetaminophen (NORCO) 5-325 MG tablet Take 1 tablet by mouth every 6 (six) hours as needed for moderate pain. 04/25/15   Dorna Leitz, PA-C  meloxicam (MOBIC) 15 MG tablet Take 1 tablet (15 mg total) by mouth daily. 04/25/15   Dorna Leitz, PA-C  norgestimate-ethinyl estradiol (ORTHO-CYCLEN,SPRINTEC,PREVIFEM) 0.25-35 MG-MCG tablet Take 1 tablet by mouth daily. Reported on 04/25/2015    Historical Provider, MD  oxyCODONE-acetaminophen (ROXICET) 5-325 MG tablet Take 1 tablet by mouth every 8 (eight) hours as needed for severe pain. 04/28/15   Dorna Leitz, PA-C   BP 135/97 mmHg  Pulse 94  Temp(Src) 98.5 F (36.9 C) (Oral)  Resp 18  Ht  (1.651 m)  Wt 133 lb (60.328 kg)  BMI 22.13 kg/m2  SpO2 100%  LMP 05/29/2015  Vitals reviewed Physical Exam  Physical Examination: General appearance - alert,  well appearing, and in no distress Mental status - alert, oriented to person, place, and time Eyes - no conjunctival injection no scleral icterus Chest - clear to auscultation, no wheezes, rales or rhonchi, symmetric air entry Neurological - alert, oriented, normal speech Musculoskeletal - no joint tenderness, deformity or swelling- other than in left wrist, ventral surface of left wrist with ttp and contusion overlying- able to flex and extend at wrist without difficulty, sensation intact distally Extremities - peripheral pulses normal, no pedal edema, no clubbing or cyanosis Skin - normal coloration and turgor, no rashes  ED Course  Procedures   DIAGNOSTIC STUDIES: Oxygen  Saturation is 99% on room air, normal by my interpretation.    COORDINATION OF CARE: 6:34 PM Will order wrist splint. Will give pain medication. Discussed treatment plan with pt at bedside and pt agreed to plan.  Imaging Review Dg Wrist Complete Left  06/19/2015  CLINICAL DATA:  Left wrist pain and bruising since a fall today. Initial encounter. EXAM: LEFT WRIST - COMPLETE 3+ VIEW COMPARISON:  None. FINDINGS: There is no evidence of fracture or dislocation. There is no evidence of arthropathy or other focal bone abnormality. Soft tissues are unremarkable. IMPRESSION: Negative exam. Electronically Signed   By: Drusilla Kanner M.D.   On: 06/19/2015 17:52   I have personally reviewed and evaluated these images and lab results as part of my medical decision-making.  MDM   Final diagnoses:  Contusion of left wrist, initial encounter    Pt presenting with c/o pain in left wrist after fall.   She has bruising of wrist, diffusely tender.  Distally NVI.  Xray is reassuring.  Pt feels improved after ice pack and pain medication.  Wrist splint applied.  Discharged with strict return precautions.  Pt agreeable with plan. Given information for hand surgery followup- pt states she will f/u with her orthopedic doctor.    I personally performed the services described in this documentation, which was scribed in my presence. The recorded information has been reviewed and is accurate.    Jerelyn Scott, MD 06/19/15 [phone]

## 2015-06-19 NOTE — ED Notes (Signed)
Ice pack and elevation continued, states relief with ice and pain med.

## 2015-07-24 ENCOUNTER — Ambulatory Visit (INDEPENDENT_AMBULATORY_CARE_PROVIDER_SITE_OTHER): Payer: 59 | Admitting: Family Medicine

## 2015-07-24 VITALS — BP 122/80 | HR 86 | Temp 98.6°F | Resp 16 | Ht 66.0 in | Wt 134.0 lb

## 2015-07-24 DIAGNOSIS — J069 Acute upper respiratory infection, unspecified: Secondary | ICD-10-CM

## 2015-07-24 MED ORDER — SALINE SPRAY 0.65 % NA SOLN: 1.0000 | NASAL | Status: DC

## 2015-07-24 MED ORDER — HYDROCOD POLST-CPM POLST ER 10-8 MG/5ML PO SUER: 5.0000 mL | Freq: Two times a day (BID) | ORAL | Status: DC | PRN

## 2015-07-24 MED ORDER — PSEUDOEPHEDRINE HCL ER 120 MG PO TB12: 120.0000 mg | ORAL_TABLET | Freq: Two times a day (BID) | ORAL | Status: DC

## 2015-07-24 MED ORDER — BENZONATATE 100 MG PO CAPS: 100.0000 mg | ORAL_CAPSULE | Freq: Three times a day (TID) | ORAL | Status: DC | PRN

## 2015-07-24 NOTE — Progress Notes (Signed)
Subjective:    Patient ID: Cindy Rice, female    DOB: 08-Aug-1985, 30 y.o.   MRN: [id number] By signing my name below, I, Javier Docker, attest that this documentation has been prepared under the direction and in the presence of Norberto Sorenson, MD. Electronically Signed: Javier Docker, ER Scribe. 07/24/2015. 9:03 AM.  Chief Complaint  Patient presents with  . Sore Throat    x 3 days   . Cough  . Nasal Congestion    HPI HPI Comments: Cindy Rice is a 30 y.o. female with a past hx of sports induced asthma who presents to [facility] complaining of sinus pressure under eyes, cough, sore throat and purulent nasal discharge for the last three days. Her daughter has croup and her husband has an upper respiratory infection. She denies fever or chills. She has taken robitussin, mucinex, and afrin nasal spray. Neither her daughter or husband have tested positive for strep or flu.   Past Medical History  Diagnosis Date  . Anxiety   . Asthma     sports induced   Allergies  Allergen Reactions  . Penicillins Other (See Comments)    Migraines   . Ciprofloxacin Nausea And Vomiting   Current Outpatient Prescriptions on File Prior to Visit  Medication Sig Dispense Refill  . [DISCONTINUED] DULoxetine (CYMBALTA) 30 MG capsule Take 30 mg by mouth daily.     No current facility-administered medications on file prior to visit.    Review of Systems  Constitutional: Positive for activity change, appetite change and fatigue. Negative for fever and chills.  HENT: Positive for congestion, postnasal drip, rhinorrhea, sinus pressure and sore throat. Negative for ear pain and trouble swallowing.   Respiratory: Positive for cough. Negative for shortness of breath and wheezing.   Hematological: Negative for adenopathy.  Psychiatric/Behavioral: Positive for sleep disturbance.      Objective:  BP 122/80 mmHg  Pulse 86  Temp(Src) 98.6 F (37 C) (Oral)  Resp 16  Ht  (1.676 m)  Wt  134 lb (60.782 kg)  BMI 21.64 kg/m2  SpO2 98%  LMP 06/28/2015 (Exact Date)  Physical Exam  Constitutional: She is oriented to person, place, and time. She appears well-developed and well-nourished. No distress.  HENT:  Head: Normocephalic and atraumatic.  Mouth/Throat: Oropharynx is clear and moist.  TMs retracted  Eyes: Pupils are equal, round, and reactive to light.  Neck: Neck supple.  Cardiovascular: Normal rate and normal heart sounds.  Exam reveals no gallop and no friction rub.   No murmur heard. Tachycardic   Pulmonary/Chest: Effort normal. No respiratory distress.  Musculoskeletal: Normal range of motion.  Neurological: She is alert and oriented to person, place, and time. Coordination normal.  Skin: Skin is warm and dry. She is not diaphoretic.  Psychiatric: She has a normal mood and affect. Her behavior is normal.  Nursing note and vitals reviewed.     Assessment & Plan:   1. Acute URI     Meds ordered this encounter  Medications  . benzonatate (TESSALON) 100 MG capsule    Sig: Take 1-2 capsules (100-200 mg total) by mouth 3 (three) times daily as needed for cough.    Dispense:  40 capsule    Refill:  0  . chlorpheniramine-HYDROcodone (TUSSIONEX PENNKINETIC ER) 10-8 MG/5ML SUER    Sig: Take 5 mLs by mouth every 12 (twelve) hours as needed.    Dispense:  120 mL    Refill:  0  . pseudoephedrine (  SUDAFED 12 HOUR) 120 MG 12 hr tablet    Sig: Take 1 tablet (120 mg total) by mouth 2 (two) times daily.    Dispense:  30 tablet    Refill:  0  . sodium chloride (OCEAN) 0.65 % SOLN nasal spray    Sig: Place 1 spray into both nostrils every hour while awake.    Dispense:  1 Bottle    Refill:  0    I personally performed the services described in this documentation, which was scribed in my presence. The recorded information has been reviewed and considered, and addended by me as needed.  Norberto SorensonEva Charlton Boule, MD MPH

## 2015-07-24 NOTE — Patient Instructions (Addendum)
IF you received an x-ray today, you will receive an invoice from [facility]. Please contact [facility] at [phone] with questions or concerns regarding your invoice.   IF you received labwork today, you will receive an invoice from [facility]. Please contact [facility] at [phone] with questions or concerns regarding your invoice.   Our billing staff will not be able to assist you with questions regarding bills from these companies.  You will be contacted with the lab results as soon as they are available. The fastest way to get your results is to activate your My Chart account. Instructions are located on the last page of this paperwork. If you have not heard from us regarding the results in 2 weeks, please contact this office.   Upper Respiratory Infection, Adult Most upper respiratory infections (URIs) are a viral infection of the air passages leading to the lungs. A URI affects the nose, throat, and upper air passages. The most common type of URI is nasopharyngitis and is typically referred to as "the common cold." URIs run their course and usually go away on their own. Most of the time, a URI does not require medical attention, but sometimes a bacterial infection in the upper airways can follow a viral infection. This is called a secondary infection. Sinus and middle ear infections are common types of secondary upper respiratory infections. Bacterial pneumonia can also complicate a URI. A URI can worsen asthma and chronic obstructive pulmonary disease (COPD). Sometimes, these complications can require emergency medical care and may be life threatening.  CAUSES Almost all URIs are caused by viruses. A virus is a type of germ and can spread from one person to another.  RISKS FACTORS You may be at risk for a URI if:   You smoke.   You have chronic heart or lung disease.  You have a weakened defense (immune) system.   You are very young  or very old.   You have nasal allergies or asthma.  You work in crowded or poorly ventilated areas.  You work in health care facilities or schools. SIGNS AND SYMPTOMS  Symptoms typically develop 2-3 days after you come in contact with a cold virus. Most viral URIs last 7-10 days. However, viral URIs from the influenza virus (flu virus) can last 14-18 days and are typically more severe. Symptoms may include:   Runny or stuffy (congested) nose.   Sneezing.   Cough.   Sore throat.   Headache.   Fatigue.   Fever.   Loss of appetite.   Pain in your forehead, behind your eyes, and over your cheekbones (sinus pain).  Muscle aches.  DIAGNOSIS  Your health care provider may diagnose a URI by:  Physical exam.  Tests to check that your symptoms are not due to another condition such as:  Strep throat.  Sinusitis.  Pneumonia.  Asthma. TREATMENT  A URI goes away on its own with time. It cannot be cured with medicines, but medicines may be prescribed or recommended to relieve symptoms. Medicines may help:  Reduce your fever.  Reduce your cough.  Relieve nasal congestion. HOME CARE INSTRUCTIONS   Take medicines only as directed by your health care provider.   Gargle warm saltwater or take cough drops to comfort your throat as directed by your health care provider.  Use a warm mist humidifier or inhale steam from a shower to increase air moisture. This may make it easier to breathe.  Drink enough fluid to keep your   urine clear or pale yellow.   Eat soups and other clear broths and maintain good nutrition.   Rest as needed.   Return to work when your temperature has returned to normal or as your health care provider advises. You may need to stay home longer to avoid infecting others. You can also use a face mask and careful hand washing to prevent spread of the virus.  Increase the usage of your inhaler if you have asthma.   Do not use any tobacco  products, including cigarettes, chewing tobacco, or electronic cigarettes. If you need help quitting, ask your health care provider. PREVENTION  The best way to protect yourself from getting a cold is to practice good hygiene.   Avoid oral or hand contact with people with cold symptoms.   Wash your hands often if contact occurs.  There is no clear evidence that vitamin C, vitamin E, echinacea, or exercise reduces the chance of developing a cold. However, it is always recommended to get plenty of rest, exercise, and practice good nutrition.  SEEK MEDICAL CARE IF:   You are getting worse rather than better.   Your symptoms are not controlled by medicine.   You have chills.  You have worsening shortness of breath.  You have brown or red mucus.  You have yellow or brown nasal discharge.  You have pain in your face, especially when you bend forward.  You have a fever.  You have swollen neck glands.  You have pain while swallowing.  You have white areas in the back of your throat. SEEK IMMEDIATE MEDICAL CARE IF:   You have severe or persistent:  Headache.  Ear pain.  Sinus pain.  Chest pain.  You have chronic lung disease and any of the following:  Wheezing.  Prolonged cough.  Coughing up blood.  A change in your usual mucus.  You have a stiff neck.  You have changes in your:  Vision.  Hearing.  Thinking.  Mood. MAKE SURE YOU:   Understand these instructions.  Will watch your condition.  Will get help right away if you are not doing well or get worse.   This information is not intended to replace advice given to you by your health care provider. Make sure you discuss any questions you have with your health care provider.   Document Released: 10/10/2000 Document Revised: 08/31/2014 Document Reviewed: 07/22/2013 Elsevier Interactive Patient Education 2016 Elsevier Inc.  

## 2015-07-26 ENCOUNTER — Telehealth: Payer: Self-pay

## 2015-07-26 NOTE — Telephone Encounter (Signed)
Insurance will not pay for a early refill and out of pocket it will cost >$100.  I will call in some robitussin AC or tessalon pearles for her or she can use otc delsym.

## 2015-07-26 NOTE — Telephone Encounter (Signed)
Pt is needing a refill on her tussenix cough medication she spilled it   Best number [phone]

## 2015-07-27 ENCOUNTER — Telehealth: Payer: Self-pay

## 2015-07-27 MED ORDER — GUAIFENESIN-CODEINE 100-10 MG/5ML PO SOLN: 5.0000 mL | Freq: Three times a day (TID) | ORAL | Status: DC | PRN

## 2015-07-27 NOTE — Addendum Note (Signed)
Addended by: Norberto SorensonSHAW, EVA on: 07/27/2015 03:55 PM   Modules accepted: Orders

## 2015-07-27 NOTE — Telephone Encounter (Signed)
Spoke with pt and she states the medication was really working for her at night. She has the tessalon pearles and just wants something similar to the first cough medication. I don't know what is in these cough syrups and could not tell her what is similar and what is not.

## 2015-07-27 NOTE — Telephone Encounter (Signed)
error 

## 2015-07-27 NOTE — Telephone Encounter (Signed)
Ok to call in the guaifenesin-codeine 100-10mg /675ml Take 5 ml po q8hrs prn cough. Disp 120 ml, no refills.

## 2015-07-27 NOTE — Telephone Encounter (Signed)
Pt advised of message on VM.

## 2015-07-27 NOTE — Telephone Encounter (Signed)
SPoke with pharmacist and he said that no pharmacy has this, its been on back order for a while.

## 2015-07-29 NOTE — Telephone Encounter (Signed)
Then pt can use otc delsym

## 2015-07-31 ENCOUNTER — Telehealth: Payer: Self-pay

## 2015-07-31 NOTE — Telephone Encounter (Signed)
Patient called earlier and requested cough medication.  The meds are on back order, is there something else she can take?   Dr. Clelia CroftShaw   [phone] (H)

## 2015-07-31 NOTE — Telephone Encounter (Signed)
Called rx for Robitussin AC into [facility].  Pt notified.

## 2015-07-31 NOTE — Telephone Encounter (Signed)
lmom for pt to call me back.  [facility] has this.

## 2015-07-31 NOTE — Telephone Encounter (Signed)
Called rx for Robitussin AC into [facility].  Pt notified. 

## 2015-08-02 ENCOUNTER — Telehealth: Payer: Self-pay

## 2015-08-02 NOTE — Telephone Encounter (Signed)
I'm sorry that she spilled the initial rx but it was for an acute viral illness so I am not going to give a 3rd narcotic cough syrup.  Even when she was seen in the office, she seemed to be improving from the acute illness - she can use Delsym otc or tessalon pearles (benzonatate).  If she is still ill, she should be seen as concern for secondary infection or something else triggering cough such as allergies.

## 2015-08-02 NOTE — Telephone Encounter (Signed)
Pt states she was called in a different cough medicine and not only is it not working, it taste disgusting as well and she can't take it, would like to have something else called in Please call pt at [phone]     [facility] [address]

## 2015-08-02 NOTE — Telephone Encounter (Signed)
She should be better by now. Dr. Clelia CroftShaw please advise.

## 2015-08-02 NOTE — Telephone Encounter (Signed)
Relayed message to patient.  She indicates that she will either come in or wait it out.   [phone]

## 2015-08-15 ENCOUNTER — Ambulatory Visit (INDEPENDENT_AMBULATORY_CARE_PROVIDER_SITE_OTHER): Payer: 59 | Admitting: Internal Medicine

## 2015-08-15 ENCOUNTER — Ambulatory Visit (INDEPENDENT_AMBULATORY_CARE_PROVIDER_SITE_OTHER): Payer: 59

## 2015-08-15 VITALS — BP 126/86 | HR 105 | Temp 98.7°F | Resp 17 | Ht 66.0 in | Wt 131.0 lb

## 2015-08-15 DIAGNOSIS — S300XXA Contusion of lower back and pelvis, initial encounter: Secondary | ICD-10-CM | POA: Diagnosis not present

## 2015-08-15 MED ORDER — OXYCODONE-ACETAMINOPHEN 5-325 MG PO TABS: 1.0000 | ORAL_TABLET | Freq: Four times a day (QID) | ORAL | Status: DC | PRN

## 2015-08-15 MED ORDER — MELOXICAM 7.5 MG PO TABS: 7.5000 mg | ORAL_TABLET | Freq: Every day | ORAL | Status: DC

## 2015-08-15 NOTE — Patient Instructions (Signed)
     IF you received an x-ray today, you will receive an invoice from [facility]. Please contact [facility] at [phone] with questions or concerns regarding your invoice.   IF you received labwork today, you will receive an invoice from [facility]. Please contact [facility] at [phone] with questions or concerns regarding your invoice.   Our billing staff will not be able to assist you with questions regarding bills from these companies.  You will be contacted with the lab results as soon as they are available. The fastest way to get your results is to activate your My Chart account. Instructions are located on the last page of this paperwork. If you have not heard from us regarding the results in 2 weeks, please contact this office.      

## 2015-08-15 NOTE — Progress Notes (Signed)
By signing my name below I, Shelah LewandowskyJoseph Thomas, attest that this documentation has been prepared under the direction and in the presence of Ellamae Siaobert Aviana Shevlin, MD. Electonically Signed. Shelah LewandowskyJoseph Thomas, Scribe 08/15/2015 at 7:50 PM  Subjective:    Patient ID: Cindy Rice, female    DOB: 27-Jan-1986, 30 y.o.   MRN: [id number]  Chief Complaint  Patient presents with  . Back Pain    lower back     HPI Cindy Rice is a 30 y.o. female who presents to the [facility] complaining of low back pain that started yesterday when she slipped and fell outside and landed on rocky ground. Pt states her sacrum hurts and is swollen and bruised and states that it "feels like sitting on a baseball" when she sits down.  Pt states that she slipped and fell down the stairs 4 months ago and had a hairline fracture of her sacrum and she thinks she re-injured the same area because the pain is similar.   Pt denies any numbness or tingling in her lower extremities or having any urinary or bowel incontinence. She has history of chronic recurrent low back pain dating back to at least 2013 with care at [facility]  Patient Active Problem List   Diagnosis Date Noted  . Chronic low back pain 09/06/2013    Current outpatient prescriptions:  .  pseudoephedrine (SUDAFED 12 HOUR) 120 MG 12 hr tablet, Take 1 tablet (120 mg total) by mouth 2 (two) times daily., Disp: 30 tablet, Rfl: 0 .  [DISCONTINUED] DULoxetine (CYMBALTA) 30 MG capsule, Take 30 mg by mouth daily., Disp: , Rfl:   Allergies  Allergen Reactions  . Penicillins Other (See Comments)    Migraines   . Ciprofloxacin Nausea And Vomiting   Social History   Social History  . Marital Status: Married    Social History Main Topics  . Smoking status: Former Smoker    Quit date: 07/01/2010  . Alcohol Use: Yes     Comment: occasional use  . Drug Use: No  . Sexual Activity: Yes///no current preg risk//not currently pregnant     Review of Systems Non contr    Objective:   Physical Exam  Constitutional: She is oriented to person, place, and time. She appears well-developed and well-nourished. No distress.  HENT:  Head: Normocephalic and atraumatic.  Eyes: Conjunctivae are normal. Pupils are equal, round, and reactive to light.  Neck: Neck supple.  Cardiovascular: Normal rate.   Pulmonary/Chest: Effort normal.  Musculoskeletal: Normal range of motion.  Pt's sacrum has midline swelling (4cm by 6cm) that is ecchymotic and tender to palpation. Coccyx is non-tender. Pt's straight leg raise is negative to 90 degrees bilat.   Neurological: She is alert and oriented to person, place, and time.  Skin: Skin is warm and dry. Ecchymosis (on sacrum) noted.  Psychiatric: She has a normal mood and affect. Her behavior is normal.  Nursing note and vitals reviewed.  Filed Vitals:   08/15/15 1820  BP: 126/86  Pulse: 105  Temp: 98.7 F (37.1 C)  TempSrc: Oral  Resp: 17  Height: 5\' 6"  (1.676 m)  Weight: 131 lb (59.421 kg)  SpO2: 98%   Dg Sacrum/coccyx  08/15/2015  CLINICAL DATA:  Pain following fall EXAM: SACRUM AND COCCYX - 2+ VIEW COMPARISON:  April 20, 2015 FINDINGS: Frontal, tilt frontal, and lateral views obtained. There is no fracture or diastases. Joint spaces appear intact. No apparent sacroiliitis. IMPRESSION: No fracture or diastases.  No appreciable arthropathy. Electronically Signed   By: Bretta Bang III M.D.   On: 08/15/2015 19:46       Assessment & Plan:  I have completed the patient encounter in its entirety as documented by the scribe, with editing by me where necessary. Terasa Orsini P. Merla Riches, M.D. Contusion of sacrum, initial encounter - Plan: DG Sacrum/Coccyx, oxyCODONE-acetaminophen (ROXICET) 5-325 MG tablet  Meds ordered this encounter  Medications  . oxyCODONE-acetaminophen (ROXICET) 5-325 MG tablet    Sig: Take 1 tablet by mouth every 6 (six) hours as needed for severe pain.     Dispense:  30 tablet    Refill:  0  . meloxicam (MOBIC) 7.5 MG tablet    Sig: Take 1 tablet (7.5 mg total) by mouth daily.    Dispense:  30 tablet    Refill:  0   Ice!!!! Pillow F/u 2 w if not making progress

## 2015-08-16 ENCOUNTER — Telehealth: Payer: Self-pay

## 2015-08-16 NOTE — Telephone Encounter (Signed)
Pt was seen here on 4/17 by Dr. Merla Richesoolittle. [facility] [address]. The script  she got yesterday sometimes makes her nauseous. Please advise at  [phone]

## 2015-08-17 MED ORDER — ONDANSETRON HCL 4 MG PO TABS: 4.0000 mg | ORAL_TABLET | Freq: Three times a day (TID) | ORAL | Status: DC | PRN

## 2015-08-17 NOTE — Telephone Encounter (Signed)
Meds ordered this encounter  Medications  . ondansetron (ZOFRAN) 4 MG tablet    Sig: Take 1 tablet (4 mg total) by mouth every 8 (eight) hours as needed for nausea or vomiting.    Dispense:  20 tablet    Refill:  0

## 2015-08-19 ENCOUNTER — Telehealth: Payer: Self-pay

## 2015-08-19 ENCOUNTER — Ambulatory Visit (INDEPENDENT_AMBULATORY_CARE_PROVIDER_SITE_OTHER): Payer: 59 | Admitting: Family Medicine

## 2015-08-19 VITALS — BP 120/82 | HR 104 | Temp 98.1°F | Resp 16 | Ht 66.0 in | Wt 135.8 lb

## 2015-08-19 DIAGNOSIS — S300XXA Contusion of lower back and pelvis, initial encounter: Secondary | ICD-10-CM

## 2015-08-19 MED ORDER — NAPROXEN 500 MG PO TABS: 500.0000 mg | ORAL_TABLET | Freq: Two times a day (BID) | ORAL | Status: DC

## 2015-08-19 MED ORDER — OMEPRAZOLE 40 MG PO CPDR: 40.0000 mg | DELAYED_RELEASE_CAPSULE | Freq: Every day | ORAL | Status: DC

## 2015-08-19 MED ORDER — HYDROCODONE-ACETAMINOPHEN 5-325 MG PO TABS: 1.0000 | ORAL_TABLET | Freq: Four times a day (QID) | ORAL | Status: DC | PRN

## 2015-08-19 NOTE — Telephone Encounter (Signed)
Call---double the meloxicam to 15,  and add tylenol 2 tabs 4 times a day if needed

## 2015-08-19 NOTE — Telephone Encounter (Signed)
Pt is already doing this. She would like to know if there is anything else she can do. She is working all weekend which means she will be sitting a lot.  CB#[phone]

## 2015-08-19 NOTE — Patient Instructions (Addendum)
Thank you for coming in today. Switch to naproxen.  Take omeprazole with it as needed.  Use norco sparingly.  [facility] at [facility] in [place].    Tailbone Injury The tailbone (coccyx) is the small bone at the lower end of the spine. A tailbone injury may involve stretched ligaments, bruising, or a broken bone (fracture). Tailbone injuries can be painful, and some may take a long time to heal. CAUSES This condition is often caused by falling and landing on the tailbone. Other causes include:  Repeated strain or friction from actions such as rowing and bicycling.  Childbirth. In some cases, the cause may not be known. RISK FACTORS This condition is more common in women than in men. SYMPTOMS Symptoms of this condition include:  Pain in the lower back, especially when sitting.  Pain or difficulty when standing up from a sitting position.  Bruising in the tailbone area.  Painful bowel movements.  In women, pain during intercourse. DIAGNOSIS This condition may be diagnosed based on your symptoms and a physical exam. X-rays may be taken if a fracture is suspected. You may also have other tests, such as a CT scan or MRI. TREATMENT This condition may be treated with medicines to help relieve your pain. Most tailbone injuries heal on their own in 4-6 weeks. However, recovery time may be longer if the injury involves a fracture. HOME CARE INSTRUCTIONS  Take medicines only as directed by your health care provider.  If directed, apply ice to the injured area:  Put ice in a plastic bag.  Place a towel between your skin and the bag.  Leave the ice on for 20 minutes, 2-3 times per day for the first 1-2 days.  Sit on a large, rubber or inflated ring or cushion to ease your pain. Lean forward when you are sitting to help decrease discomfort.  Avoid sitting for long periods of time.  Increase your activity as the pain allows. Perform any exercises that  are recommended by your health care provider or physical therapist.  If you have pain during bowel movements, use stool softeners as directed by your health care provider.  Eat a diet that includes plenty of fiber to help prevent constipation.  Keep all follow-up visits as directed by your health care provider. This is important. PREVENTION Wear appropriate padding and sports gear when bicycling and rowing. This can help to prevent developing an injury that is caused by repeated strain or friction. SEEK MEDICAL CARE IF:  Your pain becomes worse.  Your bowel movements cause a great deal of discomfort.  You are unable to have a bowel movement.  You have uncontrolled urine loss (urinary incontinence).  You have a fever.   This information is not intended to replace advice given to you by your health care provider. Make sure you discuss any questions you have with your health care provider.   Document Released: 04/13/2000 Document Revised: 08/31/2014 Document Reviewed: 04/12/2014 Elsevier Interactive Patient Education 2016 [employer]   IF you received an x-ray today, you will receive an invoice from [facility]. Please contact [facility] at [phone] with questions or concerns regarding your invoice.   IF you received labwork today, you will receive an invoice from [facility]. Please contact [facility] at [phone] with questions or concerns regarding your invoice.   Our billing staff will not be able to assist you with questions regarding bills from these companies.  You will be contacted  with the lab results as soon as they are available. The fastest way to get your results is to activate your My Chart account. Instructions are located on the last page of this paperwork. If you have not heard from us regarding the results in 2 weeks, please contact this office.      

## 2015-08-19 NOTE — Progress Notes (Signed)
Cindy SladeKrystin E Rice is a 30 y.o. female who presents to Urgent Care today for contusion of coccyx. Patient was seen recently in urgent care for contusion of the coccyx. X-rays were negative. She was given oxycodone and meloxicam. She knows the meloxicam is not very effective in the oxycodone causes significant vomiting. She notes continued bruising and pain with sitting.   Past Medical History  Diagnosis Date  . Anxiety   . Asthma     sports induced   Past Surgical History  Procedure Laterality Date  . Cholecystectomy     Social History  Substance Use Topics  . Smoking status: Former Smoker    Quit date: 07/01/2010  . Smokeless tobacco: Never Used  . Alcohol Use: Yes     Comment: occasional use   ROS as above Medications: Current Outpatient Prescriptions  Medication Sig Dispense Refill  . meloxicam (MOBIC) 7.5 MG tablet Take 1 tablet (7.5 mg total) by mouth daily. 30 tablet 0  . ondansetron (ZOFRAN) 4 MG tablet Take 1 tablet (4 mg total) by mouth every 8 (eight) hours as needed for nausea or vomiting. 20 tablet 0  . oxyCODONE-acetaminophen (ROXICET) 5-325 MG tablet Take 1 tablet by mouth every 6 (six) hours as needed for severe pain. 30 tablet 0  . pseudoephedrine (SUDAFED 12 HOUR) 120 MG 12 hr tablet Take 1 tablet (120 mg total) by mouth 2 (two) times daily. 30 tablet 0  . HYDROcodone-acetaminophen (NORCO/VICODIN) 5-325 MG tablet Take 1 tablet by mouth every 6 (six) hours as needed. 15 tablet 0  . naproxen (NAPROSYN) 500 MG tablet Take 1 tablet (500 mg total) by mouth 2 (two) times daily with a meal. 60 tablet 1  . omeprazole (PRILOSEC) 40 MG capsule Take 1 capsule (40 mg total) by mouth daily. 30 capsule 3  . [DISCONTINUED] DULoxetine (CYMBALTA) 30 MG capsule Take 30 mg by mouth daily.     No current facility-administered medications for this visit.   Allergies  Allergen Reactions  . Penicillins Other (See Comments)    Migraines   . Ciprofloxacin Nausea And Vomiting      Exam:  BP 120/82 mmHg  Pulse 104  Temp(Src) 98.1 F (36.7 C) (Oral)  Resp 16  Ht 5\' 6"  (1.676 m)  Wt 135 lb 12.8 oz (61.598 kg)  BMI 21.93 kg/m2  SpO2 98%  LMP 07/26/2015 Gen: Well NAD MSK: Ecchymosis and tenderness along the coccyx and lower sacrum area Normal Gait   No results found for this or any previous visit (from the past 24 hour(s)). No results found.  Assessment and Plan: 30 y.o. female with coccyx contusion. Switch to Norco and naproxen. Omeprazole for GI prophylaxis. Follow up with sports medicine as needed.  Discussed warning signs or symptoms. Please see discharge instructions. Patient expresses understanding.

## 2015-08-19 NOTE — Telephone Encounter (Signed)
Pt states she needs something not so strong to take during the day for the problem with her tailbone  Best number [phone]

## 2015-08-22 ENCOUNTER — Telehealth: Payer: Self-pay

## 2015-08-22 ENCOUNTER — Other Ambulatory Visit: Payer: Self-pay | Admitting: Family Medicine

## 2015-08-22 NOTE — Telephone Encounter (Signed)
That would be the strongest combination without getting into a category that would make her sleepy

## 2015-08-22 NOTE — Telephone Encounter (Signed)
Pt is needing a refill on her hydrocodone and neproxin    Best number is [phone]

## 2015-08-22 NOTE — Telephone Encounter (Signed)
Called no answer

## 2015-08-23 ENCOUNTER — Encounter: Payer: Self-pay | Admitting: *Deleted

## 2015-08-23 NOTE — Telephone Encounter (Signed)
Patient came to the walk in to see if her medication can be filled today. I informed patient several times that request take 24-72 hours and that it needs approval. Patient wants to know if someone will call her today because she still hasn't fully recovered.

## 2015-08-23 NOTE — Telephone Encounter (Addendum)
Pt called again this morning stating she is in need of her HYDROCODONE, states Dr Denyse Amassorey stated she may need another round. Please call pt at [phone] when ready for pick up

## 2015-08-23 NOTE — Telephone Encounter (Signed)
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 SHE DOES NOT UNDERSTAND WHY WE HAVE NOT BEEN ABLE TO FILL HER MEDICATIONS. SHE SAID SHE CALLED IN PLENTY OF TIME. SHE WILL NOW NEED TO GET A WORK NOTE BEING OUT ON Wednesday 08/24/2015 AND WILL TRY TO RETURN ON Thursday 08/25/2015. SHE NEEDS TO GET A CALL BACK AS SOON AS POSSIBLE. BEST PHONE [phone] (CELL) MBC

## 2015-08-23 NOTE — Telephone Encounter (Signed)
Pt stated that she got her refill for the naproxen and she just need a refill on hydrocodone.

## 2015-08-23 NOTE — Telephone Encounter (Signed)
Spoke with pt, she states she saw Dr. Denyse Amassorey on Friday.

## 2015-08-24 ENCOUNTER — Telehealth: Payer: Self-pay | Admitting: Family Medicine

## 2015-08-24 MED ORDER — HYDROCODONE-ACETAMINOPHEN 5-325 MG PO TABS: 1.0000 | ORAL_TABLET | Freq: Four times a day (QID) | ORAL | Status: DC | PRN

## 2015-08-24 NOTE — Telephone Encounter (Signed)
Left message for pt to call back  °

## 2015-08-24 NOTE — Telephone Encounter (Signed)
Spoke with pt. We looked up a database and it seems she is getting Rx's for narcotics with other doctors as well. I advised her to come in. Cindy LennertSarah Weber stated we could not get a note or give another refill of the pain medication. Pt understood.

## 2015-08-24 NOTE — Telephone Encounter (Signed)
Pt called requesting a refill of Norco.  I discussed this with her. She can have a refill but needs a follow up appt with me soon.

## 2015-08-31 ENCOUNTER — Ambulatory Visit (INDEPENDENT_AMBULATORY_CARE_PROVIDER_SITE_OTHER): Payer: 59 | Admitting: Family Medicine

## 2015-08-31 ENCOUNTER — Encounter: Payer: Self-pay | Admitting: Family Medicine

## 2015-08-31 VITALS — BP 129/94 | HR 106 | Wt 133.0 lb

## 2015-08-31 DIAGNOSIS — M533 Sacrococcygeal disorders, not elsewhere classified: Secondary | ICD-10-CM

## 2015-08-31 MED ORDER — HYDROCODONE-ACETAMINOPHEN 5-325 MG PO TABS
1.0000 | ORAL_TABLET | Freq: Four times a day (QID) | ORAL | Status: DC | PRN
Start: 2015-08-31 — End: 2015-09-13

## 2015-08-31 MED ORDER — DICLOFENAC SODIUM 2 % TD SOLN: 2.0000 | Freq: Two times a day (BID) | TRANSDERMAL | Status: DC

## 2015-08-31 NOTE — Patient Instructions (Signed)
Thank you for coming in today. Continue naproxen  Wean Norco.  Use pennsaid.  Return in 2-4 weeks.

## 2015-09-01 ENCOUNTER — Encounter: Payer: 59 | Admitting: Family Medicine

## 2015-09-01 NOTE — Assessment & Plan Note (Addendum)
Treat with Naproxen, pennsaid, and norco. Continue to wean the norco. F/u in a few weeks.

## 2015-09-01 NOTE — Progress Notes (Signed)
   Cindy SladeKrystin E Everson is a 30 y.o. female who presents to [facility] today for follow up coccydynia. Patient was seen at Urgent Care in mid April after falling and landing on her sacrum. She is here for follow up. She notes that the pain is improving but is still quite profound at times. She sits at work, using a cushion, but continues to have pain.  She is weaning off Norco.    Past Medical History  Diagnosis Date  . Anxiety   . Asthma     sports induced   Past Surgical History  Procedure Laterality Date  . Cholecystectomy     Social History  Substance Use Topics  . Smoking status: Former Smoker    Quit date: 07/01/2010  . Smokeless tobacco: Never Used  . Alcohol Use: Yes     Comment: occasional use   family history includes Hypertension in her mother. There is no history of Other.  ROS:  No headache, visual changes, nausea, vomiting, diarrhea, constipation, dizziness, abdominal pain, skin rash, fevers, chills, night sweats, weight loss, swollen lymph nodes, body aches, joint swelling, muscle aches, chest pain, shortness of breath, mood changes, visual or auditory hallucinations.    Medications: Current Outpatient Prescriptions  Medication Sig Dispense Refill  . HYDROcodone-acetaminophen (NORCO/VICODIN) 5-325 MG tablet Take 1 tablet by mouth every 6 (six) hours as needed. 15 tablet 0  . naproxen (NAPROSYN) 500 MG tablet Take 1 tablet (500 mg total) by mouth 2 (two) times daily with a meal. 60 tablet 1  . omeprazole (PRILOSEC) 40 MG capsule Take 1 capsule (40 mg total) by mouth daily. 30 capsule 3  . pseudoephedrine (SUDAFED 12 HOUR) 120 MG 12 hr tablet Take 1 tablet (120 mg total) by mouth 2 (two) times daily. 30 tablet 0  . Diclofenac Sodium 2 % SOLN Place 2 sprays onto the skin 2 (two) times daily. 1 Bottle 11  . [DISCONTINUED] DULoxetine (CYMBALTA) 30 MG capsule Take 30 mg by mouth daily.     No current facility-administered  medications for this visit.   Allergies  Allergen Reactions  . Penicillins Other (See Comments)    Migraines   . Ciprofloxacin Nausea And Vomiting     Exam:  BP 129/94 mmHg  Pulse 106  Wt 133 lb (60.328 kg)  LMP 07/26/2015 General: Well Developed, well nourished, and in no acute distress.  Neuro/Psych: Alert and oriented x3, extra-ocular muscles intact, able to move all 4 extremities, sensation grossly intact. Skin: Warm and dry, no rashes noted.  Respiratory: Not using accessory muscles, speaking in full sentences, trachea midline.  Cardiovascular: Pulses palpable, no extremity edema. Abdomen: Does not appear distended. MSK: ecchymosis still present on sacrum. TTP. Normal gait.    Xray sacrum 08/15/15 reviewed.   No results found for this or any previous visit (from the past 24 hour(s)). No results found.   Please see individual assessment and plan sections.

## 2015-09-12 ENCOUNTER — Telehealth: Payer: Self-pay

## 2015-09-12 NOTE — Telephone Encounter (Signed)
Pt called stating that the back pain you have been treating her for has improved but she has days in which the pain is severe. She has started working a second job and on these days she feels no relief with otc meds and muscle relaxers. Pt requests a refill of HYDROcodone-acetaminophen (NORCO/VICODIN) 5-325 MG tablet QTY 15 that she could take on days that she experiences more severe pain. Please advise.

## 2015-09-13 MED ORDER — HYDROCODONE-ACETAMINOPHEN 5-325 MG PO TABS: 1.0000 | ORAL_TABLET | Freq: Four times a day (QID) | ORAL | Status: DC | PRN

## 2015-09-13 NOTE — Telephone Encounter (Signed)
Last refill of norco for this issue.

## 2015-09-13 NOTE — Telephone Encounter (Signed)
Left detailed vm advising pt that the rx is ready for pickup and that this will be the last refill.

## 2016-05-28 ENCOUNTER — Encounter (HOSPITAL_COMMUNITY): Payer: Self-pay | Admitting: *Deleted

## 2016-05-28 ENCOUNTER — Inpatient Hospital Stay (HOSPITAL_COMMUNITY): Payer: 59

## 2016-05-28 ENCOUNTER — Inpatient Hospital Stay (HOSPITAL_COMMUNITY)
Admission: AD | Admit: 2016-05-28 | Discharge: 2016-05-29 | Disposition: A | Discharge status: Home / Self Care | Payer: 59 | Source: Ambulatory Visit | Attending: Obstetrics and Gynecology | Admitting: Obstetrics and Gynecology

## 2016-05-28 DIAGNOSIS — O200 Threatened abortion: Secondary | ICD-10-CM | POA: Diagnosis not present

## 2016-05-28 DIAGNOSIS — O209 Hemorrhage in early pregnancy, unspecified: Secondary | ICD-10-CM | POA: Diagnosis present

## 2016-05-28 DIAGNOSIS — O3680X0 Pregnancy with inconclusive fetal viability, not applicable or unspecified: Secondary | ICD-10-CM

## 2016-05-28 DIAGNOSIS — Z3A01 Less than 8 weeks gestation of pregnancy: Secondary | ICD-10-CM | POA: Insufficient documentation

## 2016-05-28 DIAGNOSIS — Z87891 Personal history of nicotine dependence: Secondary | ICD-10-CM | POA: Diagnosis not present

## 2016-05-28 LAB — URINALYSIS, ROUTINE W REFLEX MICROSCOPIC
BACTERIA UA: NONE SEEN
Bilirubin Urine: NEGATIVE
Glucose, UA: NEGATIVE mg/dL
Ketones, ur: NEGATIVE mg/dL
Leukocytes, UA: NEGATIVE
NITRITE: NEGATIVE
PH: 6 (ref 5.0–8.0)
Protein, ur: NEGATIVE mg/dL
SPECIFIC GRAVITY, URINE: 1.017 (ref 1.005–1.030)

## 2016-05-28 LAB — CBC
HCT: 36.7 % (ref 36.0–46.0)
HEMOGLOBIN: 12.8 g/dL (ref 12.0–15.0)
MCH: 30.2 pg (ref 26.0–34.0)
MCHC: 34.9 g/dL (ref 30.0–36.0)
MCV: 86.6 fL (ref 78.0–100.0)
Platelets: 176 10*3/uL (ref 150–400)
RBC: 4.24 MIL/uL (ref 3.87–5.11)
RDW: 12.6 % (ref 11.5–15.5)
WBC: 6.8 10*3/uL (ref 4.0–10.5)

## 2016-05-28 LAB — POCT PREGNANCY, URINE: PREG TEST UR: POSITIVE — AB

## 2016-05-28 NOTE — MAU Provider Note (Signed)
History     CSN: [id number]  Arrival date and time: 05/28/16 2241   First Provider Initiated Contact with Patient 05/28/16 2308      Chief Complaint  Patient presents with  . Vaginal Bleeding   Vaginal Bleeding  The patient's primary symptoms include vaginal bleeding. This is a new problem. The current episode started today (about one hour PTA. ). The problem occurs constantly. The problem has been unchanged. The patient is experiencing no pain. She is pregnant. Pertinent negatives include no chills, dysuria, fever, frequency, nausea, urgency or vomiting. The vaginal discharge was bloody. The vaginal bleeding is typical of menses. She has not been passing clots. She has not been passing tissue. Nothing aggravates the symptoms. She has tried nothing for the symptoms. Sexual activity: no intercourse in the last 24 hours.  Her menstrual history has been regular (LMP 04/03/16 ).    Past Medical History:  Diagnosis Date  . Anxiety   . Asthma    sports induced    Past Surgical History:  Procedure Laterality Date  . CHOLECYSTECTOMY      Family History  Problem Relation Age of Onset  . Hypertension Mother   . Other Neg Hx     Social History  Substance Use Topics  . Smoking status: Former Smoker    Quit date: 07/01/2010  . Smokeless tobacco: Never Used  . Alcohol use Yes     Comment: occasional use    Allergies:  Allergies  Allergen Reactions  . Penicillins Other (See Comments)    Migraines   . Ciprofloxacin Nausea And Vomiting    Prescriptions Prior to Admission  Medication Sig Dispense Refill Last Dose  . Diclofenac Sodium 2 % SOLN Place 2 sprays onto the skin 2 (two) times daily. 1 Bottle 11   . HYDROcodone-acetaminophen (NORCO/VICODIN) 5-325 MG tablet Take 1 tablet by mouth every 6 (six) hours as needed. 15 tablet 0   . naproxen (NAPROSYN) 500 MG tablet Take 1 tablet (500 mg total) by mouth 2 (two) times daily with a meal. 60 tablet 1 Taking  . omeprazole (PRILOSEC)  40 MG capsule Take 1 capsule (40 mg total) by mouth daily. 30 capsule 3 Taking  . pseudoephedrine (SUDAFED 12 HOUR) 120 MG 12 hr tablet Take 1 tablet (120 mg total) by mouth 2 (two) times daily. 30 tablet 0 Taking    Review of Systems  Constitutional: Negative for chills and fever.  Gastrointestinal: Negative for nausea and vomiting.  Genitourinary: Positive for vaginal bleeding. Negative for dysuria, frequency and urgency.   Physical Exam   Blood pressure 116/82, pulse 92, temperature 98.4 F (36.9 C), temperature source Oral, resp. rate 20, height 5\' 5"  (1.651 m), weight 143 lb 4 oz (65 kg), last menstrual period 04/03/2016.  Physical Exam  Nursing note and vitals reviewed. Constitutional: She is oriented to person, place, and time. She appears well-developed and well-nourished. No distress.  HENT:  Head: Normocephalic.  Cardiovascular: Normal rate.   Respiratory: Effort normal.  GI: Soft. There is no tenderness. There is no rebound.  Neurological: She is alert and oriented to person, place, and time.  Skin: Skin is warm and dry.  Psychiatric: She has a normal mood and affect.   Results for orders placed or performed during the hospital encounter of 05/28/16 (from the past 24 hour(s))  Urinalysis, Routine w reflex microscopic     Status: Abnormal   Collection Time: 05/28/16 11:00 PM  Result Value Ref Range   Color, Urine YELLOW  YELLOW   APPearance CLEAR CLEAR   Specific Gravity, Urine 1.017 1.005 - 1.030   pH 6.0 5.0 - 8.0   Glucose, UA NEGATIVE NEGATIVE mg/dL   Hgb urine dipstick MODERATE (A) NEGATIVE   Bilirubin Urine NEGATIVE NEGATIVE   Ketones, ur NEGATIVE NEGATIVE mg/dL   Protein, ur NEGATIVE NEGATIVE mg/dL   Nitrite NEGATIVE NEGATIVE   Leukocytes, UA NEGATIVE NEGATIVE   RBC / HPF 0-5 0 - 5 RBC/hpf   WBC, UA 0-5 0 - 5 WBC/hpf   Bacteria, UA NONE SEEN NONE SEEN   Squamous Epithelial / LPF 0-5 (A) NONE SEEN   Mucous PRESENT   Pregnancy, urine POC     Status:  Abnormal   Collection Time: 05/28/16 11:04 PM  Result Value Ref Range   Preg Test, Ur POSITIVE (A) NEGATIVE  CBC     Status: None   Collection Time: 05/28/16 11:20 PM  Result Value Ref Range   WBC 6.8 4.0 - 10.5 K/uL   RBC 4.24 3.87 - 5.11 MIL/uL   Hemoglobin 12.8 12.0 - 15.0 g/dL   HCT 16.136.7 09.636.0 - 04.546.0 %   MCV 86.6 78.0 - 100.0 fL   MCH 30.2 26.0 - 34.0 pg   MCHC 34.9 30.0 - 36.0 g/dL   RDW 40.912.6 81.111.5 - 91.415.5 %   Platelets 176 150 - 400 K/uL  hCG, quantitative, pregnancy     Status: Abnormal   Collection Time: 05/28/16 11:20 PM  Result Value Ref Range   hCG, Beta Chain, Quant, S 8,824 (H) <5 mIU/mL   [place] Ob Comp Less 14 Wks  Result Date: 05/28/2016 CLINICAL DATA:  Vaginal bleeding today. Gestational age by last menstrual period 7 weeks and 6 days. EXAM: OBSTETRIC <14 WK US AND TRANSVAGINAL OB US TECHNIQUE: Both transabdominal and transvaginal ultrasound examinations were performed for complete evaluation of the gestation as well as the maternal uterus, adnexal regions, and pelvic cul-de-sac. Transvaginal technique was performed to assess early pregnancy. COMPARISON:  None. FINDINGS: Intrauterine gestational sac: Present Yolk sac:  Not present Embryo:  Not present Cardiac Activity: Not present MSD: 9  mm   5 w   4  d Subchorionic hemorrhage:  None visualized. Maternal uterus/adnexae: Normal.  Small LEFT corpus luteal cyst. IMPRESSION: Probable early intrauterine gestational sac, but no yolk sac, fetal pole, or cardiac activity yet visualized. Recommend follow-up quantitative B-HCG levels and follow-up US in 14 days to assess viability. This recommendation follows SRU consensus guidelines: Diagnostic Criteria for Nonviable Pregnancy Early in the First Trimester. Malva Limes Engl J Med 2013; 782:9562-13; 369:1443-51. Electronically Signed   By: Awilda Metroourtnay  Bloomer M.D.   On: 05/28/2016 23:48   [place] Ob Transvaginal  Result Date: 05/28/2016 CLINICAL DATA:  Vaginal bleeding today. Gestational age by last menstrual period 7  weeks and 6 days. EXAM: OBSTETRIC <14 WK US AND TRANSVAGINAL OB US TECHNIQUE: Both transabdominal and transvaginal ultrasound examinations were performed for complete evaluation of the gestation as well as the maternal uterus, adnexal regions, and pelvic cul-de-sac. Transvaginal technique was performed to assess early pregnancy. COMPARISON:  None. FINDINGS: Intrauterine gestational sac: Present Yolk sac:  Not present Embryo:  Not present Cardiac Activity: Not present MSD: 9  mm   5 w   4  d Subchorionic hemorrhage:  None visualized. Maternal uterus/adnexae: Normal.  Small LEFT corpus luteal cyst. IMPRESSION: Probable early intrauterine gestational sac, but no yolk sac, fetal pole, or cardiac activity yet visualized. Recommend follow-up quantitative B-HCG levels and follow-up US in 14 days to  assess viability. This recommendation follows SRU consensus guidelines: Diagnostic Criteria for Nonviable Pregnancy Early in the First Trimester. Malva Limes Med 2013; 161:0960-45. Electronically Signed   By: Awilda Metro M.D.   On: 05/28/2016 23:48    MAU Course  Procedures  MDM 0057: D/W Dr. Henderson Cloud, ok for DC home. FU on Thursday for repeat HCG  Assessment and Plan   1. Threatened abortion   2. Vaginal bleeding in pregnancy, first trimester   3. Pregnancy, location unknown    DC home Comfort measures reviewed  1st Trimester precautions  Bleeding precautions Ectopic precautions RX: none  Return to MAU as needed FU with OB as planned  Follow-up Information    MORRIS, MEGAN, DO Follow up.   Specialty:  Obstetrics and Gynecology Why:  Call for an appointment to be seen Thursday morning for repeat blood work  Contact information: [address], [address], [address] [place] [address] [phone]            Tawnya Crook 05/28/2016, 11:09 PM

## 2016-05-28 NOTE — MAU Note (Signed)
PT SAYS SSHE STARTED  HAVING  LIGHT BROWN   D/C ON SAT -  CALLED  DR ON SAT-      THEN  ON  SUN  BROWN  D/C  INCREASED-   IN UNDERWEAR  .   THEN  TODAY  MORE  D/C     AND  AT   10PM  TONIGHT   -  BECAME RED BLOOD- HAD  GUSH   .-   SAYS  NOTHING  ON PAD  NOW - BUT  WAS WHEN  SHE WIPED.        HAS HAD DULL  ACHING  CRAMPS.   LAST   SEX-  LAST WEEK.     HAS AN  APPOINTMENT  WITH  DR   Bea LauraMORROW.

## 2016-05-29 DIAGNOSIS — O200 Threatened abortion: Secondary | ICD-10-CM

## 2016-05-29 LAB — HCG, QUANTITATIVE, PREGNANCY: hCG, Beta Chain, Quant, S: 8824 m[IU]/mL — ABNORMAL HIGH (ref <5)

## 2016-05-29 NOTE — Discharge Instructions (Signed)
Threatened Miscarriage °A threatened miscarriage is when you have vaginal bleeding during your first 20 weeks of pregnancy but the pregnancy has not ended. Your doctor will do tests to make sure you are still pregnant. The cause of the bleeding may not be known. This condition does not mean your pregnancy will end. It does increase the risk of it ending (complete miscarriage). °Follow these instructions at home: °· Make sure you keep all your doctor visits for prenatal care. °· Get plenty of rest. °· Do not have sex or use tampons if you have vaginal bleeding. °· Do not douche. °· Do not smoke or use drugs. °· Do not drink alcohol. °· Avoid caffeine. °Contact a doctor if: °· You have light bleeding from your vagina. °· You have belly pain or cramping. °· You have a fever. °Get help right away if: °· You have heavy bleeding from your vagina. °· You have clots of blood coming from your vagina. °· You have bad pain or cramps in your low back or belly. °· You have fever, chills, and bad belly pain. °This information is not intended to replace advice given to you by your health care provider. Make sure you discuss any questions you have with your health care provider. °Document Released: 03/29/2008 Document Revised: 09/22/2015 Document Reviewed: 02/10/2013 °Elsevier Interactive Patient Education © 2017 Elsevier Inc. ° °

## 2016-06-12 ENCOUNTER — Encounter (HOSPITAL_BASED_OUTPATIENT_CLINIC_OR_DEPARTMENT_OTHER): Payer: Self-pay | Admitting: *Deleted

## 2016-06-12 NOTE — Progress Notes (Signed)
NPO AFTER MN W/ EXCEPTION CLEAR LIQUIDS UNTIL 0700.  ARRIVE AT 1130.  CURRENT LAB RESULTS IN CHART AND EPIC.  WILL TAKE PRILOSEC AM DOS W/ SIPS OF WATER.

## 2016-06-12 NOTE — H&P (Signed)
Cindy SladeKrystin E Rice is an 31 y.o. female with missed abortion; anembryonic measuring 7w.  The patient initially elected to pursue medical management with misoprostol 800 mcg which she tried twice 24 hours apart without completion.  She elects to proceed with D&C.  Blood type positive.  Pertinent Gynecological History: Menses: n/a Bleeding: n/a Contraception: none DES exposure: unknown Blood transfusions: none Sexually transmitted diseases: no past history Previous GYN Procedures: none  Last mammogram: n/a Date: n/a Last pap: normal Date: 04/2016 OB History: G3, P1011   Menstrual History: Menarche age: n/a Patient's last menstrual period was 04/03/2016.    Past Medical History:  Diagnosis Date  . GERD (gastroesophageal reflux disease)   . Missed ab     Past Surgical History:  Procedure Laterality Date  . LAPAROSCOPIC CHOLECYSTECTOMY  2013    Family History  Problem Relation Age of Onset  . Hypertension Mother   . Other Neg Hx     Social History:  reports that she quit smoking about 5 years ago. Her smoking use included Cigarettes. She quit after 5.00 years of use. She has never used smokeless tobacco. She reports that she drinks alcohol. She reports that she does not use drugs.  Allergies:  Allergies  Allergen Reactions  . Penicillins Other (See Comments)    Migraines   . Ciprofloxacin Nausea And Vomiting  . Tramadol Other (See Comments)    "makes my body feel weird"    No prescriptions prior to admission.    ROS  Last menstrual period 04/03/2016. Physical Exam  Constitutional: She is oriented to person, place, and time. She appears well-developed and well-nourished.  GI: Soft. There is no rebound and no guarding.  Neurological: She is alert and oriented to person, place, and time.  Skin: Skin is warm and dry.  Psychiatric: She has a normal mood and affect. Her behavior is normal.    No results found for this or any previous visit (from the past 24  hour(s)).  No results found.  Assessment/Plan: 31yo [id number] with missed ab -Suction D&E -Patient has been counseled re: risk of bleeding, infection, scarring and damage to surrounding structures.  All questions were answered and the patient wishes to proceed.  Linsey Hirota 06/12/2016, 9:12 PM

## 2016-06-13 ENCOUNTER — Ambulatory Visit (HOSPITAL_BASED_OUTPATIENT_CLINIC_OR_DEPARTMENT_OTHER): Payer: 59 | Admitting: Anesthesiology

## 2016-06-13 ENCOUNTER — Encounter (HOSPITAL_BASED_OUTPATIENT_CLINIC_OR_DEPARTMENT_OTHER)
Admission: RE | Disposition: A | Discharge status: Home / Self Care | Payer: Self-pay | Source: Ambulatory Visit | Attending: Obstetrics & Gynecology

## 2016-06-13 ENCOUNTER — Ambulatory Visit (HOSPITAL_BASED_OUTPATIENT_CLINIC_OR_DEPARTMENT_OTHER)
Admission: RE | Admit: 2016-06-13 | Discharge: 2016-06-13 | Disposition: A | Discharge status: Home / Self Care | Payer: 59 | Source: Ambulatory Visit | Attending: Obstetrics & Gynecology | Admitting: Obstetrics & Gynecology

## 2016-06-13 ENCOUNTER — Encounter (HOSPITAL_BASED_OUTPATIENT_CLINIC_OR_DEPARTMENT_OTHER): Payer: Self-pay

## 2016-06-13 DIAGNOSIS — O021 Missed abortion: Secondary | ICD-10-CM | POA: Insufficient documentation

## 2016-06-13 DIAGNOSIS — Z87891 Personal history of nicotine dependence: Secondary | ICD-10-CM | POA: Diagnosis not present

## 2016-06-13 DIAGNOSIS — Z880 Allergy status to penicillin: Secondary | ICD-10-CM | POA: Insufficient documentation

## 2016-06-13 DIAGNOSIS — K219 Gastro-esophageal reflux disease without esophagitis: Secondary | ICD-10-CM | POA: Insufficient documentation

## 2016-06-13 HISTORY — DX: Missed abortion: O02.1

## 2016-06-13 HISTORY — DX: Gastro-esophageal reflux disease without esophagitis: K21.9

## 2016-06-13 HISTORY — PX: DILATION AND EVACUATION: SHX1459

## 2016-06-13 SURGERY — DILATION AND EVACUATION, UTERUS
Anesthesia: Monitor Anesthesia Care

## 2016-06-13 MED ORDER — FENTANYL CITRATE (PF) 100 MCG/2ML IJ SOLN
25.0000 ug | INTRAMUSCULAR | Status: DC | PRN
  Administered 2016-06-13: 50 ug via INTRAVENOUS
  Administered 2016-06-13: 25 ug via INTRAVENOUS
  Filled 2016-06-13: qty 1

## 2016-06-13 MED ORDER — DOXYCYCLINE HYCLATE 100 MG IV SOLR
100.0000 mg | Freq: Once | INTRAVENOUS | Status: AC
  Administered 2016-06-13: 100 mg via INTRAVENOUS
  Filled 2016-06-13 (×2): qty 100

## 2016-06-13 MED ORDER — CHLOROPROCAINE HCL 1 % IJ SOLN
INTRAMUSCULAR | Status: DC | PRN
  Administered 2016-06-13: 10 mL

## 2016-06-13 MED ORDER — PROPOFOL 500 MG/50ML IV EMUL
INTRAVENOUS | Status: DC | PRN
  Administered 2016-06-13: 200 ug/kg/min via INTRAVENOUS

## 2016-06-13 MED ORDER — FENTANYL CITRATE (PF) 100 MCG/2ML IJ SOLN
INTRAMUSCULAR | Status: AC
  Filled 2016-06-13: qty 2

## 2016-06-13 MED ORDER — LIDOCAINE 2% (20 MG/ML) 5 ML SYRINGE
INTRAMUSCULAR | Status: DC | PRN
  Administered 2016-06-13: 50 mg via INTRAVENOUS

## 2016-06-13 MED ORDER — DEXAMETHASONE SODIUM PHOSPHATE 10 MG/ML IJ SOLN
INTRAMUSCULAR | Status: AC
  Filled 2016-06-13: qty 1

## 2016-06-13 MED ORDER — PROPOFOL 10 MG/ML IV BOLUS
INTRAVENOUS | Status: AC
  Filled 2016-06-13: qty 40

## 2016-06-13 MED ORDER — LACTATED RINGERS IV SOLN
INTRAVENOUS | Status: DC
  Administered 2016-06-13: 12:00:00 via INTRAVENOUS
  Filled 2016-06-13: qty 1000

## 2016-06-13 MED ORDER — LIDOCAINE 2% (20 MG/ML) 5 ML SYRINGE
INTRAMUSCULAR | Status: AC
  Filled 2016-06-13: qty 5

## 2016-06-13 MED ORDER — OXYCODONE-ACETAMINOPHEN 5-325 MG PO TABS
1.0000 | ORAL_TABLET | ORAL | Status: DC | PRN
  Administered 2016-06-13: 1 via ORAL
  Filled 2016-06-13: qty 2

## 2016-06-13 MED ORDER — DOXYCYCLINE HYCLATE 100 MG PO TABS
200.0000 mg | ORAL_TABLET | Freq: Two times a day (BID) | ORAL | Status: AC
  Administered 2016-06-13: 200 mg via ORAL
  Filled 2016-06-13 (×2): qty 2

## 2016-06-13 MED ORDER — DEXAMETHASONE SODIUM PHOSPHATE 4 MG/ML IJ SOLN
INTRAMUSCULAR | Status: DC | PRN
  Administered 2016-06-13: 10 mg via INTRAVENOUS

## 2016-06-13 MED ORDER — MIDAZOLAM HCL 2 MG/2ML IJ SOLN
INTRAMUSCULAR | Status: AC
  Filled 2016-06-13: qty 2

## 2016-06-13 MED ORDER — FENTANYL CITRATE (PF) 100 MCG/2ML IJ SOLN
INTRAMUSCULAR | Status: DC | PRN
  Administered 2016-06-13 (×2): 25 ug via INTRAVENOUS
  Administered 2016-06-13: 50 ug via INTRAVENOUS

## 2016-06-13 MED ORDER — ONDANSETRON HCL 4 MG/2ML IJ SOLN
INTRAMUSCULAR | Status: AC
  Filled 2016-06-13: qty 2

## 2016-06-13 MED ORDER — OXYCODONE-ACETAMINOPHEN 5-325 MG PO TABS: 1.0000 | ORAL_TABLET | ORAL | 0 refills | Status: DC | PRN

## 2016-06-13 MED ORDER — PROMETHAZINE HCL 25 MG/ML IJ SOLN
6.2500 mg | INTRAMUSCULAR | Status: DC | PRN
  Filled 2016-06-13: qty 1

## 2016-06-13 MED ORDER — ONDANSETRON HCL 4 MG/2ML IJ SOLN
INTRAMUSCULAR | Status: DC | PRN
  Administered 2016-06-13: 4 mg via INTRAVENOUS

## 2016-06-13 MED ORDER — MIDAZOLAM HCL 5 MG/5ML IJ SOLN
INTRAMUSCULAR | Status: DC | PRN
  Administered 2016-06-13: 2 mg via INTRAVENOUS

## 2016-06-13 MED ORDER — IBUPROFEN 800 MG PO TABS: 800.0000 mg | ORAL_TABLET | Freq: Four times a day (QID) | ORAL | 0 refills | Status: DC | PRN

## 2016-06-13 MED ORDER — OXYCODONE-ACETAMINOPHEN 5-325 MG PO TABS
ORAL_TABLET | ORAL | Status: AC
  Filled 2016-06-13: qty 1

## 2016-06-13 MED ORDER — LACTATED RINGERS IV SOLN
INTRAVENOUS | Status: DC
  Administered 2016-06-13: 13:00:00 via INTRAVENOUS
  Filled 2016-06-13: qty 1000

## 2016-06-13 SURGICAL SUPPLY — 31 items
CANISTER SUCTION 2500CC (MISCELLANEOUS) IMPLANT
CATH ROBINSON RED A/P 16FR (CATHETERS) ×3 IMPLANT
COVER BACK TABLE 60X90IN (DRAPES) ×3 IMPLANT
DRAPE LG THREE QUARTER DISP (DRAPES) ×3 IMPLANT
DRSG TELFA 3X8 NADH (GAUZE/BANDAGES/DRESSINGS) ×3 IMPLANT
ELECT REM PT RETURN 9FT ADLT (ELECTROSURGICAL)
ELECTRODE REM PT RTRN 9FT ADLT (ELECTROSURGICAL) IMPLANT
FILTER UTR ASPR ASSEMBLY (MISCELLANEOUS) ×3 IMPLANT
GLOVE BIO SURGEON STRL SZ 6 (GLOVE) ×6 IMPLANT
GLOVE BIO SURGEON STRL SZ7.5 (GLOVE) ×3 IMPLANT
GLOVE BIOGEL PI IND STRL 6 (GLOVE) ×1 IMPLANT
GLOVE BIOGEL PI IND STRL 7.5 (GLOVE) ×2 IMPLANT
GLOVE BIOGEL PI INDICATOR 6 (GLOVE) ×2
GLOVE BIOGEL PI INDICATOR 7.5 (GLOVE) ×4
HOSE CONNECTING 18IN BERKELEY (TUBING) ×3 IMPLANT
KIT BERKELEY 1ST TRIMESTER 3/8 (MISCELLANEOUS) ×3 IMPLANT
KIT ROOM TURNOVER WOR (KITS) ×3 IMPLANT
LEGGING LITHOTOMY PAIR STRL (DRAPES) ×3 IMPLANT
NEEDLE SPNL 22GX3.5 QUINCKE BK (NEEDLE) IMPLANT
PACK BASIN DAY SURGERY FS (CUSTOM PROCEDURE TRAY) ×3 IMPLANT
SET BERKELEY SUCTION TUBING (SUCTIONS) ×3 IMPLANT
SYR CONTROL 10ML LL (SYRINGE) IMPLANT
TOWEL OR 17X24 6PK STRL BLUE (TOWEL DISPOSABLE) ×6 IMPLANT
TRAY DSU PREP LF (CUSTOM PROCEDURE TRAY) ×3 IMPLANT
TUBE CONNECTING 12'X1/4 (SUCTIONS)
TUBE CONNECTING 12X1/4 (SUCTIONS) IMPLANT
VACURETTE 6 ASPIR F TIP BERK (CANNULA) IMPLANT
VACURETTE 7MM CVD STRL WRAP (CANNULA) IMPLANT
VACURETTE 8 RIGID CVD (CANNULA) IMPLANT
VACURETTE 9 RIGID CVD (CANNULA) IMPLANT
WATER STERILE IRR 500ML POUR (IV SOLUTION) ×3 IMPLANT

## 2016-06-13 NOTE — Anesthesia Preprocedure Evaluation (Addendum)
Anesthesia Evaluation  Patient identified by MRN, date of birth, ID band Patient awake    Reviewed: Allergy & Precautions, NPO status , Patient's Chart, lab work & pertinent test results  History of Anesthesia Complications Negative for: history of anesthetic complications  Airway Mallampati: II  TM Distance: >3 FB Neck ROM: Full    Dental  (+) Teeth Intact, Dental Advisory Given   Pulmonary former smoker,    Pulmonary exam normal breath sounds clear to auscultation       Cardiovascular Exercise Tolerance: Good negative cardio ROS Normal cardiovascular exam Rhythm:Regular Rate:Normal     Neuro/Psych negative neurological ROS  negative psych ROS   GI/Hepatic Neg liver ROS, GERD  Medicated and Controlled,  Endo/Other  negative endocrine ROS  Renal/GU negative Renal ROS     Musculoskeletal negative musculoskeletal ROS (+)   Abdominal   Peds  Hematology negative hematology ROS (+)   Anesthesia Other Findings Day of surgery medications reviewed with the patient.  Reproductive/Obstetrics Missed abortion                            Anesthesia Physical Anesthesia Plan  ASA: II  Anesthesia Plan: MAC   Post-op Pain Management:    Induction: Intravenous  Airway Management Planned: Nasal Cannula  Additional Equipment:   Intra-op Plan:   Post-operative Plan:   Informed Consent: I have reviewed the patients History and Physical, chart, labs and discussed the procedure including the risks, benefits and alternatives for the proposed anesthesia with the patient or authorized representative who has indicated his/her understanding and acceptance.   Dental advisory given  Plan Discussed with: CRNA and Anesthesiologist  Anesthesia Plan Comments: (Discussed risks/benefits/alternatives to MAC sedation including need for ventilatory support, hypotension, need for conversion to general  anesthesia.  All patient questions answered.  Patient/guardian wishes to proceed.)        Anesthesia Quick Evaluation

## 2016-06-13 NOTE — Op Note (Signed)
PREOPERATIVE DIAGNOSIS: 31y.o. With missed abortion  POSTOPERATIVE DIAGNOSIS: The same  PROCEDURE: Suction Dilation and Curettage   SURGEON: Dr. Mitchel HonourMegan Monika Chestang  INDICATIONS: 31y.o. here for scheduled surgery for treatment of missed abortion. Risks of surgery were discussed with the patient including but not limited to: bleeding which may require transfusion; infection which may require antibiotics; injury to uterus or surrounding organs; intrauterine scarring which may impair future fertility; need for additional procedures including laparotomy or laparoscopy; and other postoperative/anesthesia complications. Written informed consent was obtained.   FINDINGS: An 8 week size uterus.   ANESTHESIA:LMA, paracervical block  ESTIMATED BLOOD LOSS:25cc  SPECIMENS: POC sent to pathology   COMPLICATIONS: None immediate.  PROCEDURE DETAILS: The patient received intravenous antibiotics while in the preoperative area. She was then taken to the operating room where conscious sedation was administered and was found to be adequate. After an adequate timeout was performed, she was placed in the dorsal lithotomy position and examined; then prepped and draped in the sterile manner. Her bladder was catheterized for an unmeasured amount of clear, yellow urine. A speculum was then placed in the patient's vagina and a single tooth tenaculum was applied to the anterior lip of the cervix.Using 10cc 1% nesacaine, paracervical block was performed. The uterus was dilated manually with metal dilators to accommodate the 7mm suction curette. Once the cervix was dilated, the suction curette was advanced without difficulty. A total of twopasses were taken with tissue returned. A sharp curette was used to feel for gritty texture circumferentially which was present. A final pass with the suction curette was performed to remove blood. The tenaculum was removed from the anterior lip of the cervix  and the vaginal speculum was removed after noting good hemostasis. The patient tolerated the procedure well and was taken to the recovery area awake, extubated and in stable condition.

## 2016-06-13 NOTE — Progress Notes (Signed)
No change to H&P.  Cindy Wheller, DO 

## 2016-06-13 NOTE — Anesthesia Postprocedure Evaluation (Signed)
Anesthesia Post Note  Patient: Cindy Rice  Procedure(s) Performed: Procedure(s) (LRB): DILATATION AND EVACUATION (N/A)  Patient location during evaluation: PACU Anesthesia Type: MAC Level of consciousness: awake and alert Pain management: pain level controlled Vital Signs Assessment: post-procedure vital signs reviewed and stable Respiratory status: spontaneous breathing, nonlabored ventilation, respiratory function stable and patient connected to nasal cannula oxygen Cardiovascular status: stable and blood pressure returned to baseline Anesthetic complications: no       Last Vitals:  Vitals:   06/13/16 1415 06/13/16 1500  BP: (!) 96/52 (!) 99/49  Pulse: 82 80  Resp: 14 16  Temp:  36.9 C    Last Pain:  Vitals:   06/13/16 1500  PainSc: 2                  Catalina Gravel

## 2016-06-13 NOTE — Transfer of Care (Signed)
Immediate Anesthesia Transfer of Care Note  Patient: Cindy Rice  Procedure(s) Performed: Procedure(s): DILATATION AND EVACUATION (N/A)  Patient Location: PACU  Anesthesia Type:MAC  Level of Consciousness: awake, alert , oriented and patient cooperative  Airway & Oxygen Therapy: Patient Spontanous Breathing and Patient connected to nasal cannula oxygen  Post-op Assessment: Report given to RN and Post -op Vital signs reviewed and stable  Post vital signs: Reviewed and stable  Last Vitals: There were no vitals filed for this visit.  Last Pain:  Vitals:   06/13/16 1150  PainSc: 1       Patients Stated Pain Goal: 8 (16/10/96 0454)  Complications: No apparent anesthesia complications

## 2016-06-13 NOTE — Anesthesia Procedure Notes (Signed)
Procedure Name: MAC Date/Time: 06/13/2016 1:00 PM Performed by: Wanita Chamberlain Pre-anesthesia Checklist: Patient identified, Timeout performed, Emergency Drugs available, Suction available and Patient being monitored Patient Re-evaluated:Patient Re-evaluated prior to inductionIntubation Type: IV induction Placement Confirmation: positive ETCO2 Dental Injury: Teeth and Oropharynx as per pre-operative assessment

## 2016-06-13 NOTE — Discharge Instructions (Signed)
Call MD for T>100.4, heavy vaginal bleeding, severe abdominal pain or respiratory distress.  Call office to schedule postop appointment in 2 weeks.  No driving while taking narcotics.  Pelvic rest x 2 weeks.   D & C Home care Instructions:   Personal hygiene:  Used sanitary napkins for vaginal drainage not tampons. Shower or tub bathe the day after your procedure. No douching until bleeding stops. Always wipe from front to back after  Elimination.  Activity: Do not drive or operate any equipment today. The effects of the anesthesia are still present and drowsiness may result. Rest today, not necessarily flat bed rest, just take it easy. You may resume your normal activity in one to 2 days.  Sexual activity: No intercourse for one week or as indicated by your physician  Diet: Eat a light diet as desired this evening. You may resume a regular diet tomorrow.  Return to work: One to 2 days.  General Expectations of your surgery: Vaginal bleeding should be no heavier than a normal period. Spotting may continue up to 10 days. Mild cramps may continue for a couple of days. You may have a regular period in 2-6 weeks.  Unexpected observations call your doctor if these occur: persistent or heavy bleeding. Severe abdominal cramping or pain. Elevation of temperature greater than 100F.  Call for an appointment in one week.    Patient's Signature_______________________________________________________  Nurse's Signature________________________________________________________  Post Anesthesia Home Care Instructions  Activity: Get plenty of rest for the remainder of the day. A responsible adult should stay with you for 24 hours following the procedure.  For the next 24 hours, DO NOT: -Drive a car -Advertising copywriterperate machinery -Drink alcoholic beverages -Take any medication unless instructed by your physician -Make any legal decisions or sign important papers.  Meals: Start with liquid foods such as  gelatin or soup. Progress to regular foods as tolerated. Avoid greasy, spicy, heavy foods. If nausea and/or vomiting occur, drink only clear liquids until the nausea and/or vomiting subsides. Call your physician if vomiting continues.  Special Instructions/Symptoms: Your throat may feel dry or sore from the anesthesia or the breathing tube placed in your throat during surgery. If this causes discomfort, gargle with warm salt water. The discomfort should disappear within 24 hours.  If you had a scopolamine patch placed behind your ear for the management of post- operative nausea and/or vomiting:  1. The medication in the patch is effective for 72 hours, after which it should be removed.  Wrap patch in a tissue and discard in the trash. Wash hands thoroughly with soap and water. 2. You may remove the patch earlier than 72 hours if you experience unpleasant side effects which may include dry mouth, dizziness or visual disturbances. 3. Avoid touching the patch. Wash your hands with soap and water after contact with the patch.

## 2016-06-14 ENCOUNTER — Encounter (HOSPITAL_BASED_OUTPATIENT_CLINIC_OR_DEPARTMENT_OTHER): Payer: Self-pay | Admitting: Obstetrics & Gynecology

## 2016-10-24 ENCOUNTER — Other Ambulatory Visit: Payer: Self-pay | Admitting: Nurse Practitioner

## 2016-10-24 DIAGNOSIS — N644 Mastodynia: Secondary | ICD-10-CM

## 2016-10-30 ENCOUNTER — Ambulatory Visit
Admission: RE | Admit: 2016-10-30 | Discharge: 2016-10-30 | Disposition: A | Discharge status: Home / Self Care | Payer: 59 | Source: Ambulatory Visit | Attending: Nurse Practitioner | Admitting: Nurse Practitioner

## 2016-10-30 ENCOUNTER — Other Ambulatory Visit: Payer: Self-pay | Admitting: Nurse Practitioner

## 2016-10-30 ENCOUNTER — Other Ambulatory Visit: Payer: 59

## 2016-10-30 DIAGNOSIS — N644 Mastodynia: Secondary | ICD-10-CM

## 2016-10-30 DIAGNOSIS — N6320 Unspecified lump in the left breast, unspecified quadrant: Secondary | ICD-10-CM

## 2016-11-01 ENCOUNTER — Other Ambulatory Visit: Payer: Self-pay | Admitting: Nurse Practitioner

## 2016-11-01 ENCOUNTER — Ambulatory Visit
Admission: RE | Admit: 2016-11-01 | Discharge: 2016-11-01 | Disposition: A | Discharge status: Home / Self Care | Payer: 59 | Source: Ambulatory Visit | Attending: Nurse Practitioner | Admitting: Nurse Practitioner

## 2016-11-01 DIAGNOSIS — N6320 Unspecified lump in the left breast, unspecified quadrant: Secondary | ICD-10-CM

## 2016-12-05 IMAGING — CR DG SACRUM/COCCYX 2+V
3 series · 3 of 3 positions shown · non-contrast
Comparison: None.

CLINICAL DATA: Tail bone pain status post fall.

EXAM:
SACRUM AND COCCYX - 2+ VIEW

[AP]
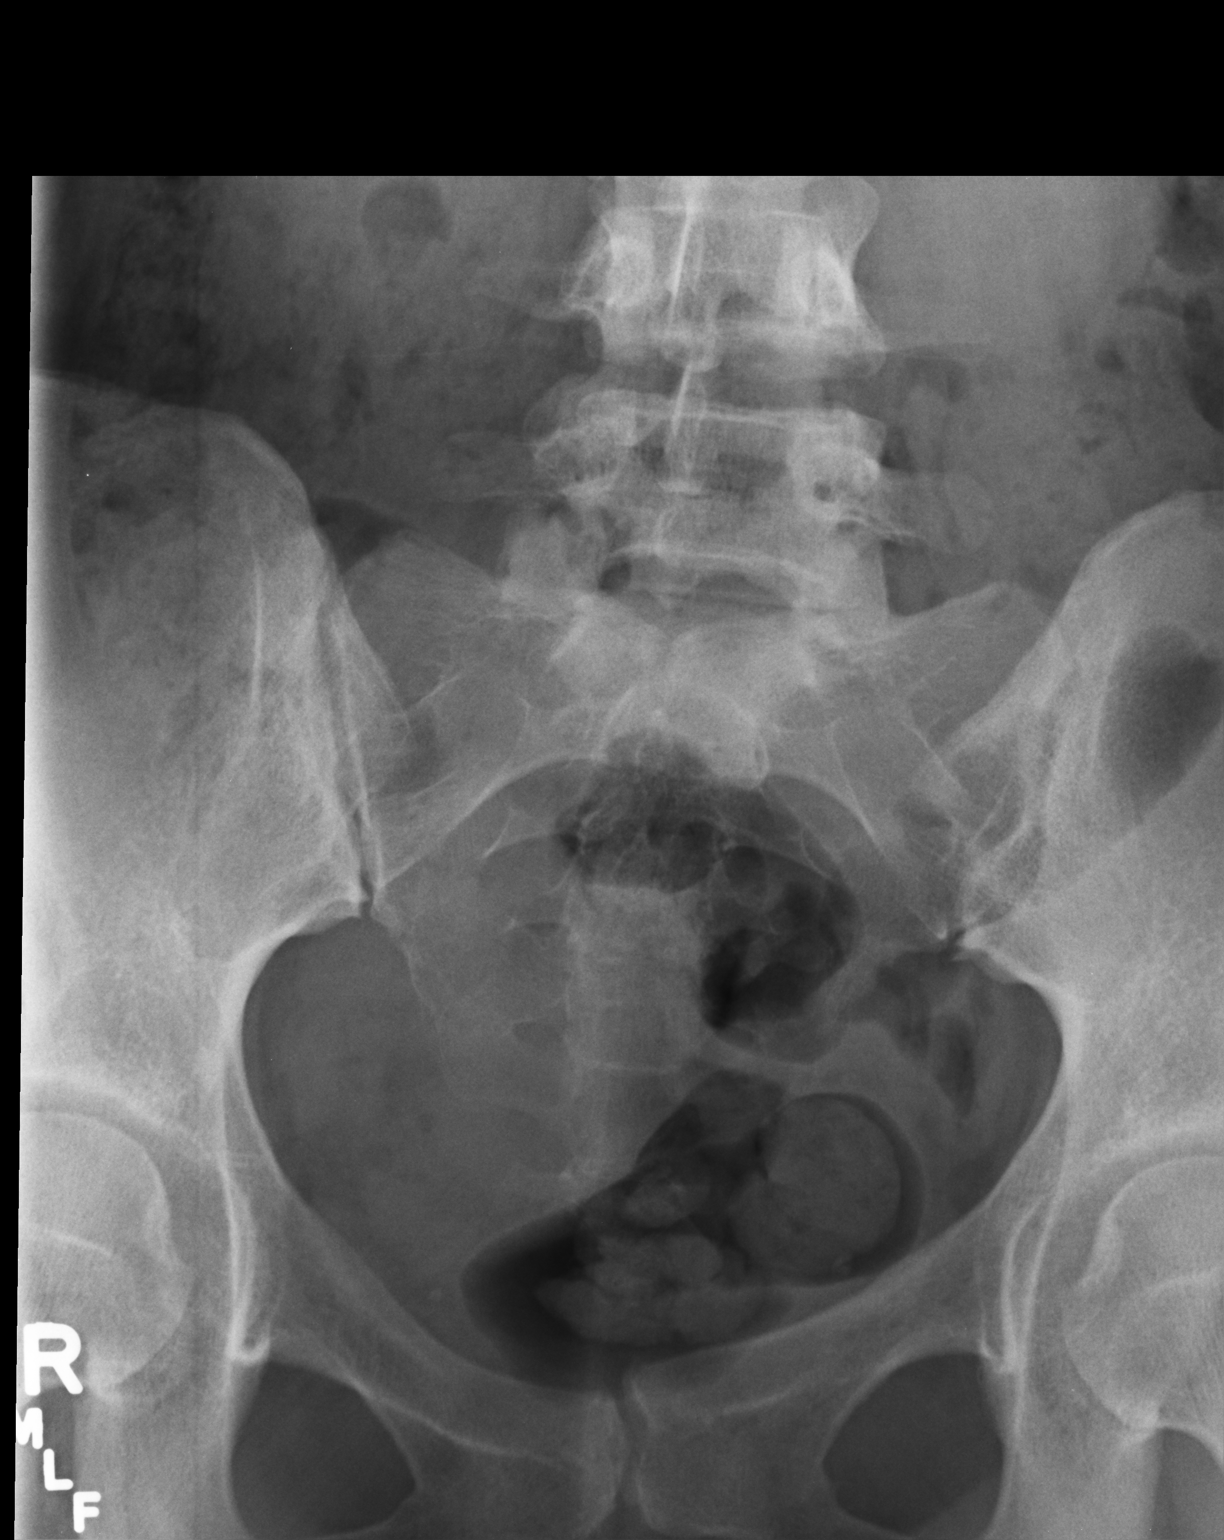

[lateral]
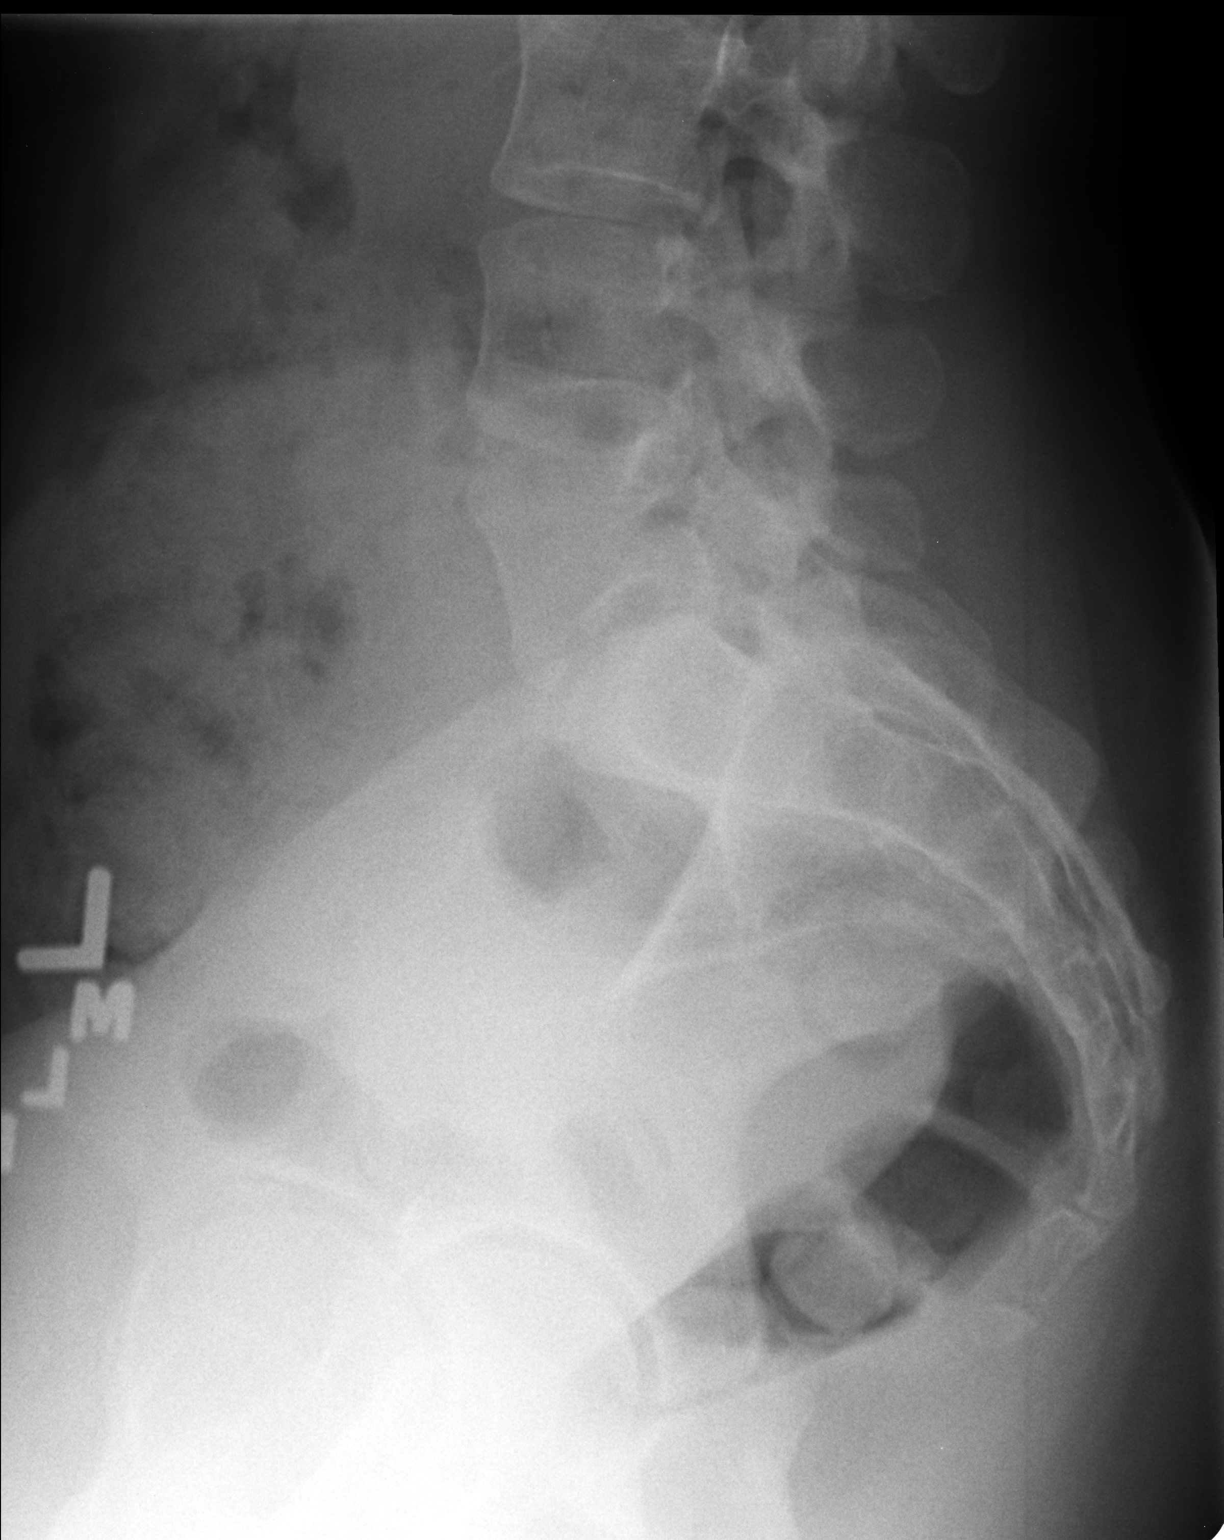

[ap axial]
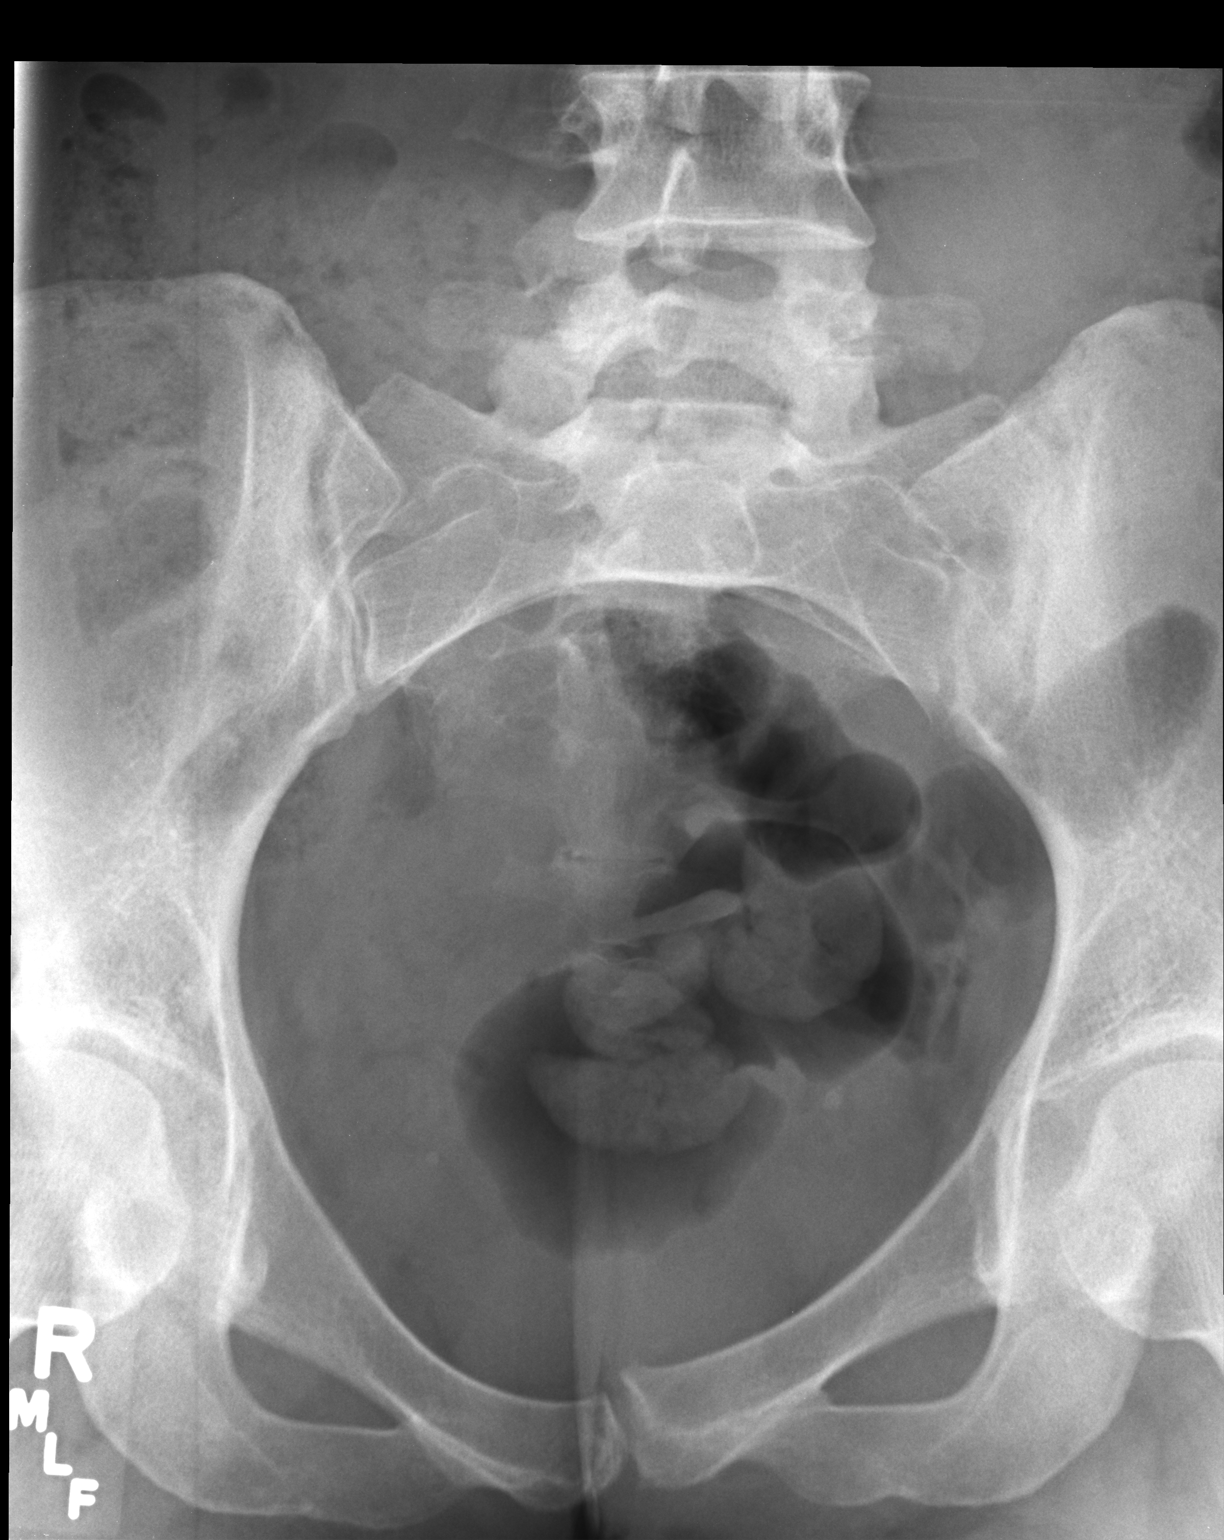

[3 of 3 positions shown; findings below may reference images not displayed]

FINDINGS: There is no evidence of fracture or other focal bone lesions.
IMPRESSION: Negative.

## 2017-03-04 LAB — OB RESULTS CONSOLE ABO/RH: RH TYPE: POSITIVE

## 2017-03-04 LAB — OB RESULTS CONSOLE HEPATITIS B SURFACE ANTIGEN: Hepatitis B Surface Ag: NEGATIVE

## 2017-03-04 LAB — OB RESULTS CONSOLE RUBELLA ANTIBODY, IGM: Rubella: IMMUNE

## 2017-03-04 LAB — OB RESULTS CONSOLE HIV ANTIBODY (ROUTINE TESTING): HIV: NONREACTIVE

## 2017-03-04 LAB — OB RESULTS CONSOLE RPR: RPR: NONREACTIVE

## 2017-03-04 LAB — OB RESULTS CONSOLE ANTIBODY SCREEN: ANTIBODY SCREEN: NEGATIVE

## 2017-04-30 NOTE — L&D Delivery Note (Signed)
Delivery Note  SVD viable female (Cindy Rice) Apgars 8,9 over intact perineum.  Placenta delivered spontaneously intact with 3VC. Good support and hemostasis noted.  R/V exam confirms.  PH art was sent.   Mother and baby to couplet care and are doing well.  EBL 200cc  Cindy Campavid Shakirah Kirkey, MD

## 2017-09-05 ENCOUNTER — Other Ambulatory Visit (HOSPITAL_COMMUNITY): Payer: Self-pay

## 2017-09-06 ENCOUNTER — Ambulatory Visit (HOSPITAL_COMMUNITY)
Admission: RE | Admit: 2017-09-06 | Discharge: 2017-09-06 | Disposition: A | Discharge status: Home / Self Care | Payer: 59 | Source: Ambulatory Visit | Attending: Obstetrics & Gynecology | Admitting: Obstetrics & Gynecology

## 2017-09-06 ENCOUNTER — Encounter (HOSPITAL_COMMUNITY): Payer: 59

## 2017-09-06 DIAGNOSIS — O99212 Obesity complicating pregnancy, second trimester: Secondary | ICD-10-CM | POA: Diagnosis not present

## 2017-09-06 DIAGNOSIS — O26892 Other specified pregnancy related conditions, second trimester: Secondary | ICD-10-CM | POA: Diagnosis not present

## 2017-09-06 MED ORDER — SODIUM CHLORIDE 0.9 % IV SOLN
510.0000 mg | INTRAVENOUS | Status: DC
  Administered 2017-09-06: 510 mg via INTRAVENOUS
  Filled 2017-09-06: qty 17

## 2017-09-06 NOTE — Discharge Instructions (Signed)

## 2017-09-09 ENCOUNTER — Inpatient Hospital Stay (HOSPITAL_COMMUNITY): Admission: RE | Admit: 2017-09-09 | Payer: 59 | Source: Ambulatory Visit

## 2017-09-09 ENCOUNTER — Ambulatory Visit (HOSPITAL_COMMUNITY)
Admission: RE | Admit: 2017-09-09 | Discharge: 2017-09-09 | Disposition: A | Discharge status: Home / Self Care | Payer: 59 | Source: Ambulatory Visit | Attending: Obstetrics & Gynecology | Admitting: Obstetrics & Gynecology

## 2017-09-09 DIAGNOSIS — O99212 Obesity complicating pregnancy, second trimester: Secondary | ICD-10-CM | POA: Insufficient documentation

## 2017-09-09 MED ORDER — SODIUM CHLORIDE 0.9 % IV SOLN
510.0000 mg | INTRAVENOUS | Status: DC
  Administered 2017-09-09: 510 mg via INTRAVENOUS
  Filled 2017-09-09: qty 17

## 2017-09-12 LAB — OB RESULTS CONSOLE GBS: GBS: POSITIVE

## 2017-09-19 ENCOUNTER — Encounter (HOSPITAL_COMMUNITY): Payer: Self-pay | Admitting: *Deleted

## 2017-09-19 ENCOUNTER — Telehealth (HOSPITAL_COMMUNITY): Payer: Self-pay | Admitting: *Deleted

## 2017-09-19 NOTE — Telephone Encounter (Signed)
Preadmission screen  

## 2017-09-27 ENCOUNTER — Inpatient Hospital Stay (HOSPITAL_COMMUNITY): Payer: 59 | Admitting: Anesthesiology

## 2017-09-27 ENCOUNTER — Inpatient Hospital Stay (HOSPITAL_COMMUNITY)
Admission: RE | Admit: 2017-09-27 | Discharge: 2017-09-28 | DRG: 807 | Disposition: A | Discharge status: Home / Self Care | Payer: 59 | Attending: Obstetrics and Gynecology | Admitting: Obstetrics and Gynecology

## 2017-09-27 ENCOUNTER — Other Ambulatory Visit: Payer: Self-pay

## 2017-09-27 ENCOUNTER — Encounter (HOSPITAL_COMMUNITY): Payer: Self-pay

## 2017-09-27 DIAGNOSIS — Z87891 Personal history of nicotine dependence: Secondary | ICD-10-CM

## 2017-09-27 DIAGNOSIS — Z880 Allergy status to penicillin: Secondary | ICD-10-CM

## 2017-09-27 DIAGNOSIS — O26893 Other specified pregnancy related conditions, third trimester: Secondary | ICD-10-CM | POA: Diagnosis present

## 2017-09-27 DIAGNOSIS — Z3490 Encounter for supervision of normal pregnancy, unspecified, unspecified trimester: Secondary | ICD-10-CM | POA: Diagnosis present

## 2017-09-27 DIAGNOSIS — Z3A39 39 weeks gestation of pregnancy: Secondary | ICD-10-CM

## 2017-09-27 DIAGNOSIS — O99824 Streptococcus B carrier state complicating childbirth: Principal | ICD-10-CM | POA: Diagnosis present

## 2017-09-27 HISTORY — DX: Anemia, unspecified: D64.9

## 2017-09-27 LAB — CBC
HEMATOCRIT: 35.9 % — AB (ref 36.0–46.0)
HEMOGLOBIN: 12.2 g/dL (ref 12.0–15.0)
MCH: 29.8 pg (ref 26.0–34.0)
MCHC: 34 g/dL (ref 30.0–36.0)
MCV: 87.8 fL (ref 78.0–100.0)
Platelets: 98 10*3/uL — ABNORMAL LOW (ref 150–400)
RBC: 4.09 MIL/uL (ref 3.87–5.11)
RDW: 14.7 % (ref 11.5–15.5)
WBC: 7.3 10*3/uL (ref 4.0–10.5)

## 2017-09-27 LAB — TYPE AND SCREEN
ABO/RH(D): O POS
ANTIBODY SCREEN: NEGATIVE

## 2017-09-27 LAB — PLATELET COUNT
PLATELETS: 102 10*3/uL — AB (ref 150–400)
PLATELETS: 98 10*3/uL — AB (ref 150–400)

## 2017-09-27 LAB — RPR: RPR: NONREACTIVE

## 2017-09-27 MED ORDER — LIDOCAINE HCL (PF) 1 % IJ SOLN
INTRAMUSCULAR | Status: DC | PRN
  Administered 2017-09-27: 5 mL via EPIDURAL

## 2017-09-27 MED ORDER — ONDANSETRON HCL 4 MG/2ML IJ SOLN: 4.0000 mg | Freq: Four times a day (QID) | INTRAMUSCULAR | Status: DC | PRN

## 2017-09-27 MED ORDER — OXYCODONE-ACETAMINOPHEN 5-325 MG PO TABS: 2.0000 | ORAL_TABLET | ORAL | Status: DC | PRN

## 2017-09-27 MED ORDER — PHENYLEPHRINE 40 MCG/ML (10ML) SYRINGE FOR IV PUSH (FOR BLOOD PRESSURE SUPPORT)
80.0000 ug | PREFILLED_SYRINGE | INTRAVENOUS | Status: DC | PRN
  Administered 2017-09-27 (×2): 80 ug via INTRAVENOUS
  Filled 2017-09-27: qty 5

## 2017-09-27 MED ORDER — TERBUTALINE SULFATE 1 MG/ML IJ SOLN
0.2500 mg | Freq: Once | INTRAMUSCULAR | Status: DC | PRN
  Filled 2017-09-27: qty 1

## 2017-09-27 MED ORDER — ONDANSETRON HCL 4 MG/2ML IJ SOLN: 4.0000 mg | INTRAMUSCULAR | Status: DC | PRN

## 2017-09-27 MED ORDER — LACTATED RINGERS IV SOLN: 500.0000 mL | Freq: Once | INTRAVENOUS | Status: DC

## 2017-09-27 MED ORDER — LACTATED RINGERS IV SOLN
500.0000 mL | Freq: Once | INTRAVENOUS | Status: AC
  Administered 2017-09-27: 500 mL via INTRAVENOUS

## 2017-09-27 MED ORDER — SENNOSIDES-DOCUSATE SODIUM 8.6-50 MG PO TABS
2.0000 | ORAL_TABLET | ORAL | Status: DC
  Administered 2017-09-28: 2 via ORAL
  Filled 2017-09-27: qty 2

## 2017-09-27 MED ORDER — PHENYLEPHRINE 40 MCG/ML (10ML) SYRINGE FOR IV PUSH (FOR BLOOD PRESSURE SUPPORT): 80.0000 ug | PREFILLED_SYRINGE | INTRAVENOUS | Status: DC | PRN

## 2017-09-27 MED ORDER — EPHEDRINE 5 MG/ML INJ: 10.0000 mg | INTRAVENOUS | Status: DC | PRN

## 2017-09-27 MED ORDER — OXYCODONE-ACETAMINOPHEN 5-325 MG PO TABS
1.0000 | ORAL_TABLET | ORAL | Status: DC | PRN
  Administered 2017-09-28 (×2): 1 via ORAL
  Filled 2017-09-27 (×2): qty 1

## 2017-09-27 MED ORDER — IBUPROFEN 600 MG PO TABS
600.0000 mg | ORAL_TABLET | Freq: Four times a day (QID) | ORAL | Status: DC
  Administered 2017-09-27 – 2017-09-28 (×5): 600 mg via ORAL
  Filled 2017-09-27 (×5): qty 1

## 2017-09-27 MED ORDER — TETANUS-DIPHTH-ACELL PERTUSSIS 5-2.5-18.5 LF-MCG/0.5 IM SUSP: 0.5000 mL | Freq: Once | INTRAMUSCULAR | Status: DC

## 2017-09-27 MED ORDER — CEFAZOLIN SODIUM-DEXTROSE 1-4 GM/50ML-% IV SOLN
1.0000 g | Freq: Three times a day (TID) | INTRAVENOUS | Status: DC
  Filled 2017-09-27 (×2): qty 50

## 2017-09-27 MED ORDER — OXYCODONE-ACETAMINOPHEN 5-325 MG PO TABS: 1.0000 | ORAL_TABLET | ORAL | Status: DC | PRN

## 2017-09-27 MED ORDER — EPHEDRINE 5 MG/ML INJ
10.0000 mg | INTRAVENOUS | Status: DC | PRN
  Filled 2017-09-27: qty 2

## 2017-09-27 MED ORDER — BUTORPHANOL TARTRATE 1 MG/ML IJ SOLN: 1.0000 mg | INTRAMUSCULAR | Status: DC | PRN

## 2017-09-27 MED ORDER — WITCH HAZEL-GLYCERIN EX PADS
1.0000 "application " | MEDICATED_PAD | CUTANEOUS | Status: DC | PRN
  Administered 2017-09-27: 1 via TOPICAL

## 2017-09-27 MED ORDER — ACETAMINOPHEN 325 MG PO TABS
650.0000 mg | ORAL_TABLET | ORAL | Status: DC | PRN
  Administered 2017-09-28 (×2): 650 mg via ORAL
  Filled 2017-09-27 (×2): qty 2

## 2017-09-27 MED ORDER — OXYTOCIN 40 UNITS IN LACTATED RINGERS INFUSION - SIMPLE MED
1.0000 m[IU]/min | INTRAVENOUS | Status: DC
  Administered 2017-09-27: 2 m[IU]/min via INTRAVENOUS
  Filled 2017-09-27: qty 1000

## 2017-09-27 MED ORDER — CEFAZOLIN SODIUM-DEXTROSE 2-4 GM/100ML-% IV SOLN
2.0000 g | Freq: Once | INTRAVENOUS | Status: AC
  Administered 2017-09-27: 2 g via INTRAVENOUS
  Filled 2017-09-27 (×2): qty 100

## 2017-09-27 MED ORDER — LACTATED RINGERS IV SOLN: 500.0000 mL | INTRAVENOUS | Status: DC | PRN

## 2017-09-27 MED ORDER — LIDOCAINE HCL (PF) 1 % IJ SOLN
30.0000 mL | INTRAMUSCULAR | Status: DC | PRN
  Filled 2017-09-27: qty 30

## 2017-09-27 MED ORDER — OXYTOCIN BOLUS FROM INFUSION
500.0000 mL | Freq: Once | INTRAVENOUS | Status: AC
  Administered 2017-09-27: 500 mL via INTRAVENOUS

## 2017-09-27 MED ORDER — DIBUCAINE 1 % RE OINT
1.0000 "application " | TOPICAL_OINTMENT | RECTAL | Status: DC | PRN
  Administered 2017-09-27: 1 via RECTAL
  Filled 2017-09-27: qty 28

## 2017-09-27 MED ORDER — PHENYLEPHRINE 40 MCG/ML (10ML) SYRINGE FOR IV PUSH (FOR BLOOD PRESSURE SUPPORT)
80.0000 ug | PREFILLED_SYRINGE | INTRAVENOUS | Status: DC | PRN
  Filled 2017-09-27: qty 10
  Filled 2017-09-27: qty 5

## 2017-09-27 MED ORDER — MEASLES, MUMPS & RUBELLA VAC ~~LOC~~ INJ: 0.5000 mL | INJECTION | Freq: Once | SUBCUTANEOUS | Status: DC

## 2017-09-27 MED ORDER — SIMETHICONE 80 MG PO CHEW: 80.0000 mg | CHEWABLE_TABLET | ORAL | Status: DC | PRN

## 2017-09-27 MED ORDER — LACTATED RINGERS IV SOLN
INTRAVENOUS | Status: DC
  Administered 2017-09-27 (×2): via INTRAVENOUS

## 2017-09-27 MED ORDER — ACETAMINOPHEN 325 MG PO TABS: 650.0000 mg | ORAL_TABLET | ORAL | Status: DC | PRN

## 2017-09-27 MED ORDER — ZOLPIDEM TARTRATE 5 MG PO TABS
5.0000 mg | ORAL_TABLET | Freq: Every evening | ORAL | Status: DC | PRN
Start: 2017-09-27 — End: 2017-09-28

## 2017-09-27 MED ORDER — COCONUT OIL OIL: 1.0000 "application " | TOPICAL_OIL | Status: DC | PRN

## 2017-09-27 MED ORDER — DIPHENHYDRAMINE HCL 25 MG PO CAPS: 25.0000 mg | ORAL_CAPSULE | Freq: Four times a day (QID) | ORAL | Status: DC | PRN

## 2017-09-27 MED ORDER — PRENATAL MULTIVITAMIN CH
1.0000 | ORAL_TABLET | Freq: Every day | ORAL | Status: DC
  Administered 2017-09-28: 1 via ORAL
  Filled 2017-09-27: qty 1

## 2017-09-27 MED ORDER — ONDANSETRON HCL 4 MG PO TABS
4.0000 mg | ORAL_TABLET | ORAL | Status: DC | PRN
Start: 2017-09-27 — End: 2017-09-28

## 2017-09-27 MED ORDER — SOD CITRATE-CITRIC ACID 500-334 MG/5ML PO SOLN: 30.0000 mL | ORAL | Status: DC | PRN

## 2017-09-27 MED ORDER — DIPHENHYDRAMINE HCL 50 MG/ML IJ SOLN: 12.5000 mg | INTRAMUSCULAR | Status: DC | PRN

## 2017-09-27 MED ORDER — OXYTOCIN 40 UNITS IN LACTATED RINGERS INFUSION - SIMPLE MED: 2.5000 [IU]/h | INTRAVENOUS | Status: DC

## 2017-09-27 MED ORDER — BENZOCAINE-MENTHOL 20-0.5 % EX AERO
1.0000 "application " | INHALATION_SPRAY | CUTANEOUS | Status: DC | PRN
  Administered 2017-09-27: 1 via TOPICAL
  Filled 2017-09-27: qty 56

## 2017-09-27 MED ORDER — MEDROXYPROGESTERONE ACETATE 150 MG/ML IM SUSP: 150.0000 mg | INTRAMUSCULAR | Status: DC | PRN

## 2017-09-27 MED ORDER — FENTANYL 2.5 MCG/ML BUPIVACAINE 1/10 % EPIDURAL INFUSION (WH - ANES)
14.0000 mL/h | INTRAMUSCULAR | Status: DC | PRN
  Administered 2017-09-27: 14 mL/h via EPIDURAL
  Filled 2017-09-27: qty 100

## 2017-09-27 NOTE — Anesthesia Preprocedure Evaluation (Signed)
Anesthesia Evaluation  Patient identified by MRN, date of birth, ID band Patient awake    Reviewed: Allergy & Precautions, Patient's Chart, lab work & pertinent test results  Airway Mallampati: I       Dental no notable dental hx.    Pulmonary asthma , former smoker,    Pulmonary exam normal        Cardiovascular negative cardio ROS Normal cardiovascular exam     Neuro/Psych negative neurological ROS     GI/Hepatic Neg liver ROS, GERD  ,  Endo/Other  negative endocrine ROS  Renal/GU negative Renal ROS     Musculoskeletal negative musculoskeletal ROS (+)   Abdominal   Peds  Hematology negative hematology ROS (+)   Anesthesia Other Findings   Reproductive/Obstetrics (+) Pregnancy                             Anesthesia Physical Anesthesia Plan  ASA: II  Anesthesia Plan: Epidural   Post-op Pain Management:    Induction:   PONV Risk Score and Plan:   Airway Management Planned: Natural Airway  Additional Equipment:   Intra-op Plan:   Post-operative Plan:   Informed Consent: I have reviewed the patients History and Physical, chart, labs and discussed the procedure including the risks, benefits and alternatives for the proposed anesthesia with the patient or authorized representative who has indicated his/her understanding and acceptance.     Plan Discussed with:   Anesthesia Plan Comments: (Lab Results      Component                Value               Date                      WBC                      7.3                 09/27/2017                HGB                      12.2                09/27/2017                HCT                      35.9 (L)            09/27/2017                MCV                      87.8                09/27/2017                PLT                      102 (L)             09/27/2017           )        Anesthesia Quick Evaluation

## 2017-09-27 NOTE — H&P (Signed)
Cindy SladeKrystin E Rice is a 32 y.o. female presenting for IOL.  Pregnancy uncomplicated.  GBS + (clinda res). OB History    Gravida  4   Para  1   Term  1   Preterm      AB  2   Living  1     SAB  1   TAB  1   Ectopic      Multiple      Live Births             Past Medical History:  Diagnosis Date  . Anemia   . Asthma    high school  . GERD (gastroesophageal reflux disease)   . Missed ab    Past Surgical History:  Procedure Laterality Date  . DILATION AND EVACUATION N/A 06/13/2016   Procedure: DILATATION AND EVACUATION;  Surgeon: Mitchel HonourMegan Morris, DO;  [facility];  Service: Gynecology;  Laterality: N/A;  . LAPAROSCOPIC CHOLECYSTECTOMY  2013   Family History: family history includes Hypertension in her father and mother. Social History:  reports that she quit smoking about 7 years ago. Her smoking use included cigarettes. She quit after 5.00 years of use. She has never used smokeless tobacco. She reports that she drank alcohol. She reports that she does not use drugs.     Maternal Diabetes: No Genetic Screening: Normal Maternal Ultrasounds/Referrals: Normal Fetal Ultrasounds or other Referrals:  None Maternal Substance Abuse:  No Significant Maternal Medications:  None Significant Maternal Lab Results:  None Other Comments:  None  ROS History   Blood pressure 114/78, pulse 90, temperature 98.5 F (36.9 C), temperature source Oral, resp. rate 16, last menstrual period 12/28/2016, unknown if currently breastfeeding. Exam Physical Exam  Prenatal labs: ABO, Rh: O/Positive/-- (11/05 0000) Antibody: Negative (11/05 0000) Rubella: Immune (11/05 0000) RPR: Nonreactive (11/05 0000)  HBsAg: Negative (11/05 0000)  HIV: Non-reactive (11/05 0000)  GBS: Positive (05/16 0000)   Assessment/Plan: IUP at 39 + weeks Ancef for GBS due to PCN allergy and Can take cephalosporins AROM and pitcoin  Anticipate SVD  Yeimi Debnam C 09/27/2017, 8:58  AM

## 2017-09-27 NOTE — Lactation Note (Signed)
This note was copied from a baby's chart. Lactation Consultation Note Baby 32 hrs old. Mom stated baby BF great after delivery. Has been spitting up since. Mom has tried to BF recently, baby not interested. Baby spitting up silently. LC changed diaper, baby turned dark maroon color going towards turning bluish. LC provided gentle back blows, baby spit out some mucous. Suction bulb used baby's color returned to pink. Mom stated baby has done that several times while baby sleeping and it is very scary, mom afraid to rest. Arm bands verified w/mom and baby. Transported to CN w/mom following. Report given to J. McNabb RN, asked to monitor baby while mom rest for a while.  Mom has 90 yr old that she BF for 1 yr. Mom states that is her plan as well to Bf for 1 yr. Newborn behavior, STS, cluster feeding, I&O discussed. Mom encouraged to feed baby 8-12 times/24 hours and with feeding cues.  Encouraged to call for questions or assistance.  [facility]/LC brochure given w/resources, support groups and LC services.  Patient Name: Girl Huxley Vanwagoner [id number] Date: 09/27/2017 Reason for consult: Initial assessment   Maternal Data Has patient been taught Hand Expression?: Yes Does the patient have breastfeeding experience prior to this delivery?: Yes  Feeding Feeding Type: Breast Fed Length of feed: 0 min  LATCH Score Latch: Too sleepy or reluctant, no latch achieved, no sucking elicited.  Audible Swallowing: None  Type of Nipple: Everted at rest and after stimulation  Comfort (Breast/Nipple): Soft / non-tender  Hold (Positioning): No assistance needed to correctly position infant at breast.  LATCH Score: 6  Interventions Interventions: Breast feeding basics reviewed  Lactation Tools Discussed/Used WIC Program: No   Consult Status Consult Status: Follow-up Date: 32/01/19 Follow-up type: In-patient    Charyl Dancer 32/31/2019, 11:53 PM

## 2017-09-27 NOTE — Anesthesia Pain Management Evaluation Note (Signed)
  CRNA Pain Management Visit Note  Patient: Cindy Rice, 32 y.o., female  "Hello I am a member of the anesthesia team at [facility]. We have an anesthesia team available at all times to provide care throughout the hospital, including epidural management and anesthesia for C-section. I don't know your plan for the delivery whether it a natural birth, water birth, IV sedation, nitrous supplementation, doula or epidural, but we want to meet your pain goals."   1.Was your pain managed to your expectations on prior hospitalizations?   Yes   2.What is your expectation for pain management during this hospitalization?     Epidural  3.How can we help you reach that goal? Waiting for epidural now  Record the patient's initial score and the patient's pain goal.   Pain: 5  Pain Goal: 6 [facility] wants you to be able to say your pain was always managed very well.  Edison Pace 09/27/2017

## 2017-09-27 NOTE — Anesthesia Procedure Notes (Signed)
Epidural Patient location during procedure: OB Start time: 09/27/2017 1:45 PM End time: 09/27/2017 1:51 PM  Staffing Anesthesiologist: Shelton SilvasHollis, Kery Batzel D, MD Performed: anesthesiologist   Preanesthetic Checklist Completed: patient identified, site marked, surgical consent, pre-op evaluation, timeout performed, IV checked, risks and benefits discussed and monitors and equipment checked  Epidural Patient position: sitting Prep: ChloraPrep Patient monitoring: heart rate, continuous pulse ox and blood pressure Approach: midline Location: L3-L4 Injection technique: LOR saline  Needle:  Needle type: Tuohy  Needle gauge: 17 G Needle length: 9 cm Catheter type: closed end flexible Catheter size: 20 Guage Test dose: negative and 1.5% lidocaine  Assessment Events: blood not aspirated, injection not painful, no injection resistance and no paresthesia  Additional Notes LOR @ 4.5  Patient identified. Risks/Benefits/Options discussed with patient including but not limited to bleeding, infection, nerve damage, paralysis, failed block, incomplete pain control, headache, blood pressure changes, nausea, vomiting, reactions to medications, itching and postpartum back pain. Confirmed with bedside nurse the patient's most recent platelet count. Confirmed with patient that they are not currently taking any anticoagulation, have any bleeding history or any family history of bleeding disorders. Patient expressed understanding and wished to proceed. All questions were answered. Sterile technique was used throughout the entire procedure. Please see nursing notes for vital signs. Test dose was given through epidural catheter and negative prior to continuing to dose epidural or start infusion. Warning signs of high block given to the patient including shortness of breath, tingling/numbness in hands, complete motor block, or any concerning symptoms with instructions to call for help. Patient was given instructions  on fall risk and not to get out of bed. All questions and concerns addressed with instructions to call with any issues or inadequate analgesia.    Reason for block:procedure for pain

## 2017-09-28 LAB — CBC
HCT: 33.6 % — ABNORMAL LOW (ref 36.0–46.0)
Hemoglobin: 11.2 g/dL — ABNORMAL LOW (ref 12.0–15.0)
MCH: 29.2 pg (ref 26.0–34.0)
MCHC: 33.3 g/dL (ref 30.0–36.0)
MCV: 87.5 fL (ref 78.0–100.0)
PLATELETS: 101 10*3/uL — AB (ref 150–400)
RBC: 3.84 MIL/uL — ABNORMAL LOW (ref 3.87–5.11)
RDW: 14.6 % (ref 11.5–15.5)
WBC: 11 10*3/uL — ABNORMAL HIGH (ref 4.0–10.5)

## 2017-09-28 MED ORDER — OXYCODONE-ACETAMINOPHEN 5-325 MG PO TABS: 1.0000 | ORAL_TABLET | ORAL | 0 refills | Status: AC | PRN

## 2017-09-28 MED ORDER — IBUPROFEN 600 MG PO TABS: 600.0000 mg | ORAL_TABLET | Freq: Four times a day (QID) | ORAL | 0 refills | Status: AC

## 2017-09-28 NOTE — Discharge Summary (Signed)
Obstetric Discharge Summary Reason for Admission: induction of labor Prenatal Procedures: none Intrapartum Procedures: spontaneous vaginal delivery Postpartum Procedures: none Complications-Operative and Postpartum: none Hemoglobin  Date Value Ref Range Status  09/28/2017 11.2 (L) 12.0 - 15.0 g/dL Final   HCT  Date Value Ref Range Status  09/28/2017 33.6 (L) 36.0 - 46.0 % Final    Physical Exam:  General: alert, cooperative, appears stated age and mild distress Lochia: appropriate Uterine Fundus: firm Incision: healing well DVT Evaluation: No evidence of DVT seen on physical exam.  Discharge Diagnoses: Term Pregnancy-delivered  Discharge Information: Date: 09/28/2017 Activity: pelvic rest Diet: routine Medications: Ibuprofen and Percocet Condition: stable Instructions: refer to practice specific booklet Discharge to: home   Newborn Data: Live born female  Birth Weight: 7 lb 6.9 oz (3371 g) APGAR: 8, 9  Newborn Delivery   Birth date/time:  09/27/2017 17:09:00 Delivery type:  Vaginal, Spontaneous     Home with mother.  Renee Beale C 09/28/2017, 9:44 AM

## 2017-09-28 NOTE — Anesthesia Postprocedure Evaluation (Signed)
Anesthesia Post Note  Patient: Cindy Rice  Procedure(s) Performed: AN AD HOC LABOR EPIDURAL     Patient location during evaluation: Mother Baby Anesthesia Type: Epidural Level of consciousness: awake and alert and oriented Pain management: satisfactory to patient Vital Signs Assessment: post-procedure vital signs reviewed and stable Respiratory status: respiratory function stable and spontaneous breathing Cardiovascular status: blood pressure returned to baseline Postop Assessment: no headache, no backache, spinal receding, patient able to bend at knees and adequate PO intake Anesthetic complications: no    Last Vitals:  Vitals:   09/28/17 0520 09/28/17 0935  BP: 121/75 109/78  Pulse: 65 76  Resp: 18 16  Temp: 36.7 C 36.8 C  SpO2: 98%     Last Pain:  Vitals:   09/28/17 0935  TempSrc: Oral  PainSc: 5    Pain Goal: Patients Stated Pain Goal: 4 (09/28/17 0935)               Karleen DolphinFUSSELL,Aara Jacquot

## 2017-09-29 ENCOUNTER — Ambulatory Visit: Payer: Self-pay

## 2017-09-29 NOTE — Lactation Note (Signed)
This note was copied from a baby's chart. Lactation Consultation Note  Patient Name: Cindy Rice ZOXWR'UToday's Date: 09/29/2017 Reason for consult: Follow-up assessment;Hyperbilirubinemia  Visited with P2 Mom of term infant at 3041 hrs old.  Baby at 8% weight loss. On double phototherapy.  Mom started giving bottles of formula over night due to increased fussiness on the breast.  Baby cueing to eat, so offered assist/assess at the breast.  Baby positioned in cross cradle hold.  Mom using C hold, and demonstrated/assisted her to use a U hold at base of breast.  Hand expression for colostrum drops.  Baby was able to attain a deep areolar grasp, without any discomfort felt.  Baby noted to have regular swallows.  FOB taught how to do the gentle chin tug to open baby's mouth wider, and flange lower lip.  Mom has a Spectra pump at home.  Encouraged pumping after every other feeding.  Paced bottle feeding taught, as well as a Foley Cup brought in room and showed Mom how to use this.  Encouraged using EBM 15-30 ml if available.  Engorgement prevention and treatment discussed.  Mom desires a follow-up OP appointment.  Referral sent to OP clinic.  Mom knows to call with any questions.    Feeding Feeding Type: Breast Fed Nipple Type: Slow - flow  LATCH Score Latch: Grasps breast easily, tongue down, lips flanged, rhythmical sucking.  Audible Swallowing: Spontaneous and intermittent  Type of Nipple: Everted at rest and after stimulation  Comfort (Breast/Nipple): Soft / non-tender  Hold (Positioning): Assistance needed to correctly position infant at breast and maintain latch.  LATCH Score: 9  Interventions Interventions: Breast feeding basics reviewed;Assisted with latch;Skin to skin;Breast massage;Hand express;Breast compression;Adjust position;Support pillows;Position options;Expressed milk   Consult Status Consult Status: Complete Date: 09/29/17 Follow-up type: Call as  needed    [name], [name] 09/29/2017, 10:15 AM

## 2017-09-29 NOTE — Lactation Note (Signed)
This note was copied from a baby's chart. Lactation Consultation Note  Patient Name: Cindy Rice [id number]'XToday's Date: 09/29/2017   [facility]:  Baby patient here for phototherapy.  Mother trying to get infant latched on when I arrived.  Mother stated that baby has been very fussy and will not latch.    I tried to get infant to suck on my gloved finger to calm her.  She was biting and trying to push my finger out.  It took many attempts to get her to calm down and suck effectively on my finger.  When she did I assisted mother to latch baby in the football hold onto the right breast.  Baby pulling head back, fussy and did not want to latch.  Attempted on the left breast with the same result.  I suggested to mother that we try the cross cradle hold.  Mother willing to try stating that she has not been this fussy.  Infant also still burping and spitting up a small amount of amniotic fluid which may be contributing to the fussiness.  After a few attempts baby was able to latch and suck in the cross cradle hold.  Encouraged mother to continue breast compressions during feeding.  Infant beginning to get sleepy so showed mother techniques to keep her awake and sucking.  Infant continued to suck for approximately 10 minutes before moving to the other breast.  Again, after a few attempts she was able to latch and stay on the breast.    Suggested mother do hand expression after the feeding and to collect any EBM she may obtain for baby.  Colostrum container at bedside.  Mother will call for assistance as needed.  RN updated.                  Delbert Darley R Peyten Punches 09/29/2017, 12:07 AM
